# Patient Record
Sex: Male | Born: 1965 | Race: Black or African American | Hispanic: No | Marital: Married | Smoking: Never smoker
Health system: Southern US, Community
[De-identification: ages and names within clinical notes are randomized; demographics above are authoritative.]

---

## 2003-09-06 ENCOUNTER — Emergency Department (HOSPITAL_COMMUNITY): Admission: EM | Admit: 2003-09-06 | Discharge: 2003-09-07 | Payer: Self-pay | Admitting: Emergency Medicine

## 2013-03-11 ENCOUNTER — Other Ambulatory Visit: Payer: Self-pay | Admitting: Family Medicine

## 2013-03-11 ENCOUNTER — Ambulatory Visit
Admission: RE | Admit: 2013-03-11 | Discharge: 2013-03-11 | Disposition: A | Discharge status: Home / Self Care | Payer: BC Managed Care – PPO | Source: Ambulatory Visit | Attending: Family Medicine | Admitting: Family Medicine

## 2013-03-11 DIAGNOSIS — M25512 Pain in left shoulder: Principal | ICD-10-CM

## 2013-03-11 DIAGNOSIS — M25511 Pain in right shoulder: Secondary | ICD-10-CM

## 2013-03-11 DIAGNOSIS — M542 Cervicalgia: Secondary | ICD-10-CM

## 2013-06-10 ENCOUNTER — Other Ambulatory Visit: Payer: Self-pay | Admitting: Family Medicine

## 2013-06-10 DIAGNOSIS — M542 Cervicalgia: Secondary | ICD-10-CM

## 2013-06-16 ENCOUNTER — Other Ambulatory Visit: Payer: BC Managed Care – PPO

## 2013-06-22 ENCOUNTER — Ambulatory Visit
Admission: RE | Admit: 2013-06-22 | Discharge: 2013-06-22 | Disposition: A | Discharge status: Home / Self Care | Payer: BC Managed Care – PPO | Source: Ambulatory Visit | Attending: Family Medicine | Admitting: Family Medicine

## 2013-06-22 DIAGNOSIS — M542 Cervicalgia: Secondary | ICD-10-CM

## 2013-09-28 ENCOUNTER — Other Ambulatory Visit: Payer: Self-pay | Admitting: Family Medicine

## 2013-09-28 DIAGNOSIS — D353 Benign neoplasm of craniopharyngeal duct: Principal | ICD-10-CM

## 2013-09-28 DIAGNOSIS — D352 Benign neoplasm of pituitary gland: Secondary | ICD-10-CM

## 2013-10-06 ENCOUNTER — Other Ambulatory Visit: Payer: BC Managed Care – PPO

## 2013-10-16 ENCOUNTER — Ambulatory Visit
Admission: RE | Admit: 2013-10-16 | Discharge: 2013-10-16 | Disposition: A | Discharge status: Home / Self Care | Payer: BC Managed Care – PPO | Source: Ambulatory Visit | Attending: Family Medicine | Admitting: Family Medicine

## 2013-10-16 DIAGNOSIS — D352 Benign neoplasm of pituitary gland: Secondary | ICD-10-CM

## 2013-10-16 DIAGNOSIS — D353 Benign neoplasm of craniopharyngeal duct: Principal | ICD-10-CM

## 2013-10-16 MED ORDER — GADOBENATE DIMEGLUMINE 529 MG/ML IV SOLN
9.0000 mL | Freq: Once | INTRAVENOUS | Status: AC | PRN
  Administered 2013-10-16: 9 mL via INTRAVENOUS

## 2019-08-02 ENCOUNTER — Other Ambulatory Visit: Payer: Self-pay

## 2019-08-02 ENCOUNTER — Emergency Department (HOSPITAL_COMMUNITY): Payer: No Typology Code available for payment source

## 2019-08-02 ENCOUNTER — Emergency Department (HOSPITAL_COMMUNITY)
Admission: EM | Admit: 2019-08-02 | Discharge: 2019-08-03 | Disposition: A | Discharge status: Home / Self Care | Payer: No Typology Code available for payment source | Attending: Emergency Medicine | Admitting: Emergency Medicine

## 2019-08-02 ENCOUNTER — Encounter (HOSPITAL_COMMUNITY): Payer: Self-pay | Admitting: Emergency Medicine

## 2019-08-02 DIAGNOSIS — Y92410 Unspecified street and highway as the place of occurrence of the external cause: Secondary | ICD-10-CM | POA: Diagnosis not present

## 2019-08-02 DIAGNOSIS — Y998 Other external cause status: Secondary | ICD-10-CM | POA: Diagnosis not present

## 2019-08-02 DIAGNOSIS — S93401A Sprain of unspecified ligament of right ankle, initial encounter: Secondary | ICD-10-CM | POA: Insufficient documentation

## 2019-08-02 DIAGNOSIS — Y9389 Activity, other specified: Secondary | ICD-10-CM | POA: Diagnosis not present

## 2019-08-02 DIAGNOSIS — S301XXA Contusion of abdominal wall, initial encounter: Secondary | ICD-10-CM | POA: Diagnosis not present

## 2019-08-02 DIAGNOSIS — S161XXA Strain of muscle, fascia and tendon at neck level, initial encounter: Secondary | ICD-10-CM | POA: Insufficient documentation

## 2019-08-02 DIAGNOSIS — Z79899 Other long term (current) drug therapy: Secondary | ICD-10-CM | POA: Diagnosis not present

## 2019-08-02 DIAGNOSIS — M25512 Pain in left shoulder: Secondary | ICD-10-CM | POA: Insufficient documentation

## 2019-08-02 DIAGNOSIS — S3991XA Unspecified injury of abdomen, initial encounter: Secondary | ICD-10-CM | POA: Diagnosis present

## 2019-08-02 LAB — COMPREHENSIVE METABOLIC PANEL
ALT: 43 U/L (ref 0–44)
AST: 28 U/L (ref 15–41)
Albumin: 4.1 g/dL (ref 3.5–5.0)
Alkaline Phosphatase: 62 U/L (ref 38–126)
Anion gap: 8 (ref 5–15)
BUN: 10 mg/dL (ref 6–20)
CO2: 28 mmol/L (ref 22–32)
Calcium: 9.4 mg/dL (ref 8.9–10.3)
Chloride: 103 mmol/L (ref 98–111)
Creatinine, Ser: 1.15 mg/dL (ref 0.61–1.24)
GFR calc Af Amer: >60 mL/min (ref >60)
GFR calc non Af Amer: >60 mL/min (ref >60)
Glucose, Bld: 104 mg/dL — ABNORMAL HIGH (ref 70–99)
Potassium: 3.6 mmol/L (ref 3.5–5.1)
Sodium: 139 mmol/L (ref 135–145)
Total Bilirubin: 1 mg/dL (ref 0.3–1.2)
Total Protein: 7 g/dL (ref 6.5–8.1)

## 2019-08-02 LAB — CBC WITH DIFFERENTIAL/PLATELET
Abs Immature Granulocytes: 0.01 10*3/uL (ref 0.00–0.07)
Basophils Absolute: 0 10*3/uL (ref 0.0–0.1)
Basophils Relative: 1 %
Eosinophils Absolute: 0.7 10*3/uL — ABNORMAL HIGH (ref 0.0–0.5)
Eosinophils Relative: 12 %
HCT: 44.1 % (ref 39.0–52.0)
Hemoglobin: 14.7 g/dL (ref 13.0–17.0)
Immature Granulocytes: 0 %
Lymphocytes Relative: 31 %
Lymphs Abs: 1.7 10*3/uL (ref 0.7–4.0)
MCH: 28.2 pg (ref 26.0–34.0)
MCHC: 33.3 g/dL (ref 30.0–36.0)
MCV: 84.5 fL (ref 80.0–100.0)
Monocytes Absolute: 0.6 10*3/uL (ref 0.1–1.0)
Monocytes Relative: 11 %
Neutro Abs: 2.4 10*3/uL (ref 1.7–7.7)
Neutrophils Relative %: 45 %
Platelets: 319 10*3/uL (ref 150–400)
RBC: 5.22 MIL/uL (ref 4.22–5.81)
RDW: 13.3 % (ref 11.5–15.5)
WBC: 5.4 10*3/uL (ref 4.0–10.5)
nRBC: 0 % (ref 0.0–0.2)

## 2019-08-02 MED ORDER — ACETAMINOPHEN 500 MG PO TABS
1000.0000 mg | ORAL_TABLET | Freq: Once | ORAL | Status: AC
  Administered 2019-08-02: 1000 mg via ORAL
  Filled 2019-08-02: qty 2

## 2019-08-02 MED ORDER — METHOCARBAMOL 500 MG PO TABS: 500.0000 mg | ORAL_TABLET | Freq: Two times a day (BID) | ORAL | 0 refills | Status: AC

## 2019-08-02 MED ORDER — METHOCARBAMOL 500 MG PO TABS
750.0000 mg | ORAL_TABLET | Freq: Once | ORAL | Status: AC
  Administered 2019-08-02: 750 mg via ORAL
  Filled 2019-08-02: qty 2

## 2019-08-02 MED ORDER — IOHEXOL 300 MG/ML  SOLN
100.0000 mL | Freq: Once | INTRAMUSCULAR | Status: AC | PRN
  Administered 2019-08-03: 100 mL via INTRAVENOUS

## 2019-08-02 MED ORDER — METHOCARBAMOL 500 MG PO TABS: 500.0000 mg | ORAL_TABLET | Freq: Two times a day (BID) | ORAL | 0 refills | Status: DC
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
## 2019-08-02 NOTE — Discharge Instructions (Addendum)
Your CT imaging and x-rays were reassuring.  There is no fractures or evidence of intra-abdominal bleeding.  Please drink plenty of water, rest, take Tylenol ibuprofen as recommended below and you may use Robaxin for breakthrough pain.  Please use Tylenol or ibuprofen for pain.  You may use 600 mg ibuprofen every 6 hours or 1000 mg of Tylenol every 6 hours.  You may choose to alternate between the 2.  This would be most effective.  Not to exceed 4 g of Tylenol within 24 hours.  Not to exceed 3200 mg ibuprofen 24 hours.  Please return to emergency department for any new or concerning symptoms-your work-up today was reassuring.
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
## 2019-08-03 NOTE — ED Notes (Signed)
Verbalized understanding of DC instructions, Rx, follow up care. Ambulatory 
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
## 2019-11-29 ENCOUNTER — Emergency Department (HOSPITAL_COMMUNITY)
Admission: EM | Admit: 2019-11-29 | Discharge: 2019-11-29 | Disposition: A | Discharge status: Home / Self Care | Payer: No Typology Code available for payment source | Attending: Emergency Medicine | Admitting: Emergency Medicine

## 2019-11-29 ENCOUNTER — Encounter (HOSPITAL_COMMUNITY): Payer: Self-pay | Admitting: Emergency Medicine

## 2019-11-29 ENCOUNTER — Other Ambulatory Visit: Payer: Self-pay

## 2019-11-29 DIAGNOSIS — U071 COVID-19: Secondary | ICD-10-CM | POA: Diagnosis not present

## 2019-11-29 DIAGNOSIS — R059 Cough, unspecified: Secondary | ICD-10-CM | POA: Diagnosis present

## 2019-11-29 LAB — RESPIRATORY PANEL BY RT PCR (FLU A&B, COVID)
Influenza A by PCR: NEGATIVE
Influenza B by PCR: NEGATIVE
SARS Coronavirus 2 by RT PCR: POSITIVE — AB

## 2019-11-29 LAB — CBG MONITORING, ED: Glucose-Capillary: 155 mg/dL — ABNORMAL HIGH (ref 70–99)

## 2019-11-29 NOTE — ED Notes (Signed)
Discharge instructions reviewed with patient. Patient verbalized understanding, no further questions for ED staff at this time.

## 2019-11-29 NOTE — ED Triage Notes (Signed)
Patient reports loss of taste and smell for several days , denies fever or chills , respirations unlabored, unvaccinated against Covid 19.

## 2019-11-29 NOTE — ED Provider Notes (Signed)
[facility] Provider Note   CSN: [id number] Arrival date & time: 11/29/19  0103     History Chief Complaint  Patient presents with  . Loss of Taste and Smell    [facility] is a 54 y.o. male.  Patient presents to the emergency department with a chief complaint of loss of taste and smell.  He also reports having had some diarrhea and slight cough.  He states that his symptoms started on 11/3 (11 days ago).  He denies any fevers or chills.  Denies any known Covid exposures.  He is unvaccinated against Covid.  He has been taking NyQuil with good relief.  He denies any other associated symptoms.  He is concerned about the persistent loss of taste and smell.  The history is provided by the patient. No language interpreter was used.       History reviewed. No pertinent past medical history.  There are no problems to display for this patient.   History reviewed. No pertinent surgical history.     No family history on file.  Social History   Tobacco Use  . Smoking status: Never Smoker  . Smokeless tobacco: Never Used  Substance Use Topics  . Alcohol use: Not Currently  . Drug use: Not Currently    Home Medications Prior to Admission medications   Medication Sig Start Date End Date Taking? Authorizing Provider  acetaminophen (TYLENOL) 500 MG tablet Take 500 mg by mouth every 6 (six) hours as needed for mild pain.    [provider]  amLODipine (NORVASC) 10 MG tablet Take 10 mg by mouth daily.    [provider]  cholecalciferol (VITAMIN D3) 25 MCG (1000 UNIT) tablet Take 1,000 Units by mouth daily.    [provider]  cyclobenzaprine (FLEXERIL) 10 MG tablet Take 10 mg by mouth at bedtime.    [provider]  ibuprofen (ADVIL) 200 MG tablet Take 200 mg by mouth every 6 (six) hours as needed for moderate pain.    [provider]  meloxicam (MOBIC) 15 MG tablet Take 15 mg by mouth daily.     [provider]  methocarbamol (ROBAXIN) 500 MG tablet Take 1 tablet (500 mg total) by mouth 2 (two) times daily. 08/02/19   Tedd Sias, PA    Allergies    Patient has no known allergies.  Review of Systems   Review of Systems  All other systems reviewed and are negative.   Physical Exam Updated Vital Signs BP 132/85   Pulse 77   Temp 97.8 F (36.6 C) (Oral)   Resp 10   Ht 5\' 11"  (1.803 m)   Wt 102 kg   SpO2 95%   BMI 31.36 kg/m   Physical Exam Vitals and nursing note reviewed.  Constitutional:      Appearance: He is well-developed.  HENT:     Head: Normocephalic and atraumatic.  Eyes:     Conjunctiva/sclera: Conjunctivae normal.  Cardiovascular:     Rate and Rhythm: Normal rate and regular rhythm.     Heart sounds: No murmur heard.   Pulmonary:     Effort: Pulmonary effort is normal. No respiratory distress.     Breath sounds: Normal breath sounds.  Abdominal:     Palpations: Abdomen is soft.     Tenderness: There is no abdominal tenderness.  Musculoskeletal:     Cervical back: Neck supple.  Skin:    General: Skin is warm and dry.  Neurological:  Mental Status: He is alert.     ED Results / Procedures / Treatments   Labs (all labs ordered are listed, but only abnormal results are displayed) Labs Reviewed  RESPIRATORY PANEL BY RT PCR (FLU A&B, COVID) - Abnormal; Notable for the following components:      Result Value   SARS Coronavirus 2 by RT PCR POSITIVE (*)    All other components within normal limits  CBG MONITORING, ED - Abnormal; Notable for the following components:   Glucose-Capillary 155 (*)    All other components within normal limits    EKG EKG Interpretation  Date/Time:  Sunday November 29 2019 01:39:07 EST Ventricular Rate:  87 PR Interval:    QRS Duration: 106 QT Interval:  395 QTC Calculation: 476 R Axis:   47 Text Interpretation: Sinus rhythm Confirmed by Randal Buba, April ([id number]) on 11/29/2019 1:59:13  AM   Radiology No results found.  Procedures Procedures (including critical care time)  Medications Ordered in ED Medications - No data to display  ED Course  I have reviewed the triage vital signs and the nursing notes.  Pertinent labs & imaging results that were available during my care of the patient were reviewed by me and considered in my medical decision making (see chart for details).    MDM Rules/Calculators/A&P                           [facility] was evaluated in Emergency Department on 11/29/2019 for the symptoms described in the history of present illness. He was evaluated in the context of the global COVID-19 pandemic, which necessitated consideration that the patient might be at risk for infection with the SARS-CoV-2 virus that causes COVID-19. Institutional protocols and algorithms that pertain to the evaluation of patients at risk for COVID-19 are in a state of rapid change based on information released by regulatory bodies including the CDC and federal and state organizations. These policies and algorithms were followed during the patient's care in the ED. Patients symptoms are consistent with COVID. Discussed that antibiotics are not indicated for viral infections. Pt will be discharged with symptomatic treatment.  Verbalizes understanding and is agreeable with plan. Pt is hemodynamically stable & in NAD prior to dc.   Final Clinical Impression(s) / ED Diagnoses Final diagnoses:  [id number]    Rx / DC Orders ED Discharge Orders    None       Montine Circle, PA-C 11/29/19 0600    Palumbo, April, MD 11/29/19 [phone]

## 2019-11-30 ENCOUNTER — Telehealth: Payer: Self-pay | Admitting: Nurse Practitioner

## 2019-11-30 NOTE — Telephone Encounter (Signed)
Called to Discuss with patient about Covid symptoms and the use of the monoclonal antibody infusion for those with mild to moderate Covid symptoms and at a high risk of hospitalization.     Pt appears to qualify for this infusion due to co-morbid conditions and/or a member of an at-risk group in accordance with the FDA Emergency Use Authorization. (BMI >25, hypertension).    Patient reports symptoms are resolving. He is able to smell things today and aches have resolved. Declines infusion. States he is planning to get vaccinated as soon as he can expressing wishes to have trust the medical professions prior to this.   Alda Lea, NP [facility]  [phone]

## 2020-06-23 ENCOUNTER — Other Ambulatory Visit: Payer: Self-pay

## 2020-06-23 ENCOUNTER — Ambulatory Visit (HOSPITAL_COMMUNITY)
Admission: EM | Admit: 2020-06-23 | Discharge: 2020-06-23 | Disposition: A | Discharge status: Home / Self Care | Payer: No Typology Code available for payment source | Attending: Family Medicine | Admitting: Family Medicine

## 2020-06-23 ENCOUNTER — Encounter (HOSPITAL_COMMUNITY): Payer: Self-pay

## 2020-06-23 DIAGNOSIS — S61210A Laceration without foreign body of right index finger without damage to nail, initial encounter: Secondary | ICD-10-CM | POA: Diagnosis not present

## 2020-06-23 DIAGNOSIS — Z23 Encounter for immunization: Secondary | ICD-10-CM

## 2020-06-23 MED ORDER — TETANUS-DIPHTH-ACELL PERTUSSIS 5-2.5-18.5 LF-MCG/0.5 IM SUSY
PREFILLED_SYRINGE | INTRAMUSCULAR | Status: AC
  Filled 2020-06-23: qty 0.5

## 2020-06-23 MED ORDER — LIDOCAINE HCL 2 % IJ SOLN
INTRAMUSCULAR | Status: AC
  Filled 2020-06-23: qty 20

## 2020-06-23 MED ORDER — TETANUS-DIPHTH-ACELL PERTUSSIS 5-2.5-18.5 LF-MCG/0.5 IM SUSY
0.5000 mL | PREFILLED_SYRINGE | Freq: Once | INTRAMUSCULAR | Status: AC
  Administered 2020-06-23: 15:00:00 0.5 mL via INTRAMUSCULAR

## 2020-06-23 NOTE — ED Provider Notes (Signed)
  [facility]   [id number] 06/23/20 Arrival Time: 1207  ASSESSMENT & PLAN:  1. Laceration of right index finger without foreign body without damage to nail, initial encounter     Meds ordered this encounter  Medications   Tdap (BOOSTRIX) injection 0.5 mL   Procedure: Verbal consent obtained. Patient provided with risks and alternatives to the procedure. Wound copiously irrigated with NS then cleansed with betadine. Digital block with 1% plain lidocaine. Wound carefully explored. No foreign body, tendon injury, or nonviable tissue were noted. Using sterile technique, 3 interrupted 4-0 Prolene sutures were placed to reapproximate the wound. Procedure tolerated well. No complications. Minimal bleeding. Advised to look for and return for any signs of infection such as redness, swelling, discharge, or worsening pain. Return for suture removal in 7-10 days.    Discharge Instructions       Meds ordered this encounter  Medications   Tdap (BOOSTRIX) injection 0.5 mL        See AVS for wound care instruction.  Reviewed expectations re: course of current medical issues. Questions answered. Outlined signs and symptoms indicating need for more acute intervention. Patient verbalized understanding. After Visit Summary given.   SUBJECTIVE:  [facility] is a 55 y.o. male who presents with a laceration of RIGHT distal 2nd finger; cut on metal; washed; moderate bleeding. No extremity sensation changes or weakness.   Td UTD: No.  OBJECTIVE:  Vitals:   06/23/20 1343  BP: (!) 166/98  Pulse: 97  Resp: 19  Temp: 98.6 F (37 C)  TempSrc: Oral  SpO2: 100%     General appearance: alert; no distress Skin: curved laceration of distal RIGHT 2nd fingertip; size: approx 1 cm; clean wound edges, no foreign bodies; without active bleeding; no nail involvement; normal cap refill and distal sensation Psychological: alert and cooperative; normal mood and affect   No Known  Allergies  History reviewed. No pertinent past medical history. Social History   Socioeconomic History   Marital status: Married    Spouse name: Not on file   Number of children: Not on file   Years of education: Not on file   Highest education level: Not on file  Occupational History   Not on file  Tobacco Use   Smoking status: Never   Smokeless tobacco: Never  Substance and Sexual Activity   Alcohol use: Not Currently   Drug use: Not Currently   Sexual activity: Not on file  Other Topics Concern   Not on file  Social History Narrative   Not on file   Social Determinants of Health   Financial Resource Strain: Not on file  Food Insecurity: Not on file  Transportation Needs: Not on file  Physical Activity: Not on file  Stress: Not on file  Social Connections: Not on file          Vanessa Kick, MD 06/23/20 1750

## 2020-06-23 NOTE — ED Triage Notes (Signed)
Pt in with c/o right index finger laceration that occurred today while he was cutting insulation  Pt denies numbness or tingling in hand

## 2020-06-23 NOTE — Discharge Instructions (Addendum)
Meds ordered this encounter  Medications   Tdap (BOOSTRIX) injection 0.5 mL    
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
## 2021-07-09 ENCOUNTER — Inpatient Hospital Stay (HOSPITAL_COMMUNITY)
Admission: EM | Admit: 2021-07-09 | Discharge: 2021-07-14 | DRG: 917 | Disposition: A | Discharge status: Home / Self Care | Payer: No Typology Code available for payment source | Attending: Internal Medicine | Admitting: Internal Medicine

## 2021-07-09 ENCOUNTER — Emergency Department (HOSPITAL_COMMUNITY): Payer: No Typology Code available for payment source

## 2021-07-09 DIAGNOSIS — T50901A Poisoning by unspecified drugs, medicaments and biological substances, accidental (unintentional), initial encounter: Secondary | ICD-10-CM | POA: Diagnosis present

## 2021-07-09 DIAGNOSIS — J9601 Acute respiratory failure with hypoxia: Secondary | ICD-10-CM

## 2021-07-09 DIAGNOSIS — T50904A Poisoning by unspecified drugs, medicaments and biological substances, undetermined, initial encounter: Principal | ICD-10-CM

## 2021-07-09 DIAGNOSIS — J690 Pneumonitis due to inhalation of food and vomit: Secondary | ICD-10-CM

## 2021-07-09 DIAGNOSIS — R748 Abnormal levels of other serum enzymes: Secondary | ICD-10-CM | POA: Diagnosis present

## 2021-07-09 DIAGNOSIS — E876 Hypokalemia: Secondary | ICD-10-CM | POA: Diagnosis present

## 2021-07-09 DIAGNOSIS — I959 Hypotension, unspecified: Secondary | ICD-10-CM | POA: Diagnosis present

## 2021-07-09 DIAGNOSIS — I10 Essential (primary) hypertension: Secondary | ICD-10-CM | POA: Diagnosis present

## 2021-07-09 DIAGNOSIS — N179 Acute kidney failure, unspecified: Secondary | ICD-10-CM | POA: Diagnosis present

## 2021-07-09 DIAGNOSIS — F1910 Other psychoactive substance abuse, uncomplicated: Secondary | ICD-10-CM | POA: Diagnosis present

## 2021-07-09 DIAGNOSIS — K5900 Constipation, unspecified: Secondary | ICD-10-CM | POA: Diagnosis not present

## 2021-07-09 DIAGNOSIS — T40601A Poisoning by unspecified narcotics, accidental (unintentional), initial encounter: Secondary | ICD-10-CM | POA: Diagnosis present

## 2021-07-09 DIAGNOSIS — K219 Gastro-esophageal reflux disease without esophagitis: Secondary | ICD-10-CM | POA: Diagnosis present

## 2021-07-09 DIAGNOSIS — E872 Acidosis, unspecified: Secondary | ICD-10-CM | POA: Diagnosis present

## 2021-07-09 DIAGNOSIS — S36119A Unspecified injury of liver, initial encounter: Secondary | ICD-10-CM | POA: Diagnosis present

## 2021-07-09 DIAGNOSIS — G928 Other toxic encephalopathy: Secondary | ICD-10-CM | POA: Diagnosis present

## 2021-07-09 DIAGNOSIS — J9612 Chronic respiratory failure with hypercapnia: Secondary | ICD-10-CM | POA: Diagnosis present

## 2021-07-09 DIAGNOSIS — R739 Hyperglycemia, unspecified: Secondary | ICD-10-CM | POA: Diagnosis present

## 2021-07-09 DIAGNOSIS — X58XXXA Exposure to other specified factors, initial encounter: Secondary | ICD-10-CM | POA: Diagnosis present

## 2021-07-09 DIAGNOSIS — R7401 Elevation of levels of liver transaminase levels: Secondary | ICD-10-CM | POA: Diagnosis present

## 2021-07-09 DIAGNOSIS — M1990 Unspecified osteoarthritis, unspecified site: Secondary | ICD-10-CM | POA: Diagnosis present

## 2021-07-09 DIAGNOSIS — S00511A Abrasion of lip, initial encounter: Secondary | ICD-10-CM | POA: Diagnosis present

## 2021-07-09 DIAGNOSIS — R4182 Altered mental status, unspecified: Secondary | ICD-10-CM | POA: Diagnosis present

## 2021-07-09 LAB — URINALYSIS, ROUTINE W REFLEX MICROSCOPIC
Bilirubin Urine: NEGATIVE
Glucose, UA: >=500 mg/dL — AB
Ketones, ur: NEGATIVE mg/dL
Leukocytes,Ua: NEGATIVE
Nitrite: NEGATIVE
Protein, ur: 100 mg/dL — AB
Specific Gravity, Urine: 1.017 (ref 1.005–1.030)
pH: 6 (ref 5.0–8.0)

## 2021-07-09 LAB — COMPREHENSIVE METABOLIC PANEL
ALT: 77 U/L — ABNORMAL HIGH (ref 0–44)
AST: 86 U/L — ABNORMAL HIGH (ref 15–41)
Albumin: 3.7 g/dL (ref 3.5–5.0)
Alkaline Phosphatase: 67 U/L (ref 38–126)
Anion gap: 14 (ref 5–15)
BUN: 10 mg/dL (ref 6–20)
CO2: 27 mmol/L (ref 22–32)
Calcium: 8.6 mg/dL — ABNORMAL LOW (ref 8.9–10.3)
Chloride: 96 mmol/L — ABNORMAL LOW (ref 98–111)
Creatinine, Ser: 1.83 mg/dL — ABNORMAL HIGH (ref 0.61–1.24)
GFR, Estimated: 24 mL/min — ABNORMAL LOW (ref >60)
Glucose, Bld: 324 mg/dL — ABNORMAL HIGH (ref 70–99)
Potassium: 3.4 mmol/L — ABNORMAL LOW (ref 3.5–5.1)
Sodium: 137 mmol/L (ref 135–145)
Total Bilirubin: 1.2 mg/dL (ref 0.3–1.2)
Total Protein: 6.7 g/dL (ref 6.5–8.1)

## 2021-07-09 LAB — I-STAT ARTERIAL BLOOD GAS, ED
Acid-Base Excess: 3 mmol/L — ABNORMAL HIGH (ref 0.0–2.0)
Bicarbonate: 29.9 mmol/L — ABNORMAL HIGH (ref 20.0–28.0)
Calcium, Ion: 1.15 mmol/L (ref 1.15–1.40)
HCT: 42 % (ref 39.0–52.0)
Hemoglobin: 14.3 g/dL (ref 13.0–17.0)
O2 Saturation: 99 %
Patient temperature: 97.6
Potassium: 3.1 mmol/L — ABNORMAL LOW (ref 3.5–5.1)
Sodium: 138 mmol/L (ref 135–145)
TCO2: 32 mmol/L (ref 22–32)
pCO2 arterial: 53.8 mmHg — ABNORMAL HIGH (ref 32–48)
pH, Arterial: 7.351 (ref 7.35–7.45)
pO2, Arterial: 167 mmHg — ABNORMAL HIGH (ref 83–108)

## 2021-07-09 LAB — I-STAT CHEM 8, ED
BUN: 11 mg/dL (ref 6–20)
Calcium, Ion: 1.05 mmol/L — ABNORMAL LOW (ref 1.15–1.40)
Chloride: 97 mmol/L — ABNORMAL LOW (ref 98–111)
Creatinine, Ser: 1.7 mg/dL — ABNORMAL HIGH (ref 0.61–1.24)
Glucose, Bld: 325 mg/dL — ABNORMAL HIGH (ref 70–99)
HCT: 49 % (ref 39.0–52.0)
Hemoglobin: 16.7 g/dL (ref 13.0–17.0)
Potassium: 3.3 mmol/L — ABNORMAL LOW (ref 3.5–5.1)
Sodium: 138 mmol/L (ref 135–145)
TCO2: 29 mmol/L (ref 22–32)

## 2021-07-09 LAB — CBC WITH DIFFERENTIAL/PLATELET
Abs Immature Granulocytes: 0.14 10*3/uL — ABNORMAL HIGH (ref 0.00–0.07)
Basophils Absolute: 0.1 10*3/uL (ref 0.0–0.1)
Basophils Relative: 0 %
Eosinophils Absolute: 0.8 10*3/uL — ABNORMAL HIGH (ref 0.0–0.5)
Eosinophils Relative: 6 %
HCT: 48.5 % (ref 39.0–52.0)
Hemoglobin: 16 g/dL (ref 13.0–17.0)
Immature Granulocytes: 1 %
Lymphocytes Relative: 25 %
Lymphs Abs: 3.1 10*3/uL (ref 0.7–4.0)
MCH: 29 pg (ref 26.0–34.0)
MCHC: 33 g/dL (ref 30.0–36.0)
MCV: 88 fL (ref 80.0–100.0)
Monocytes Absolute: 0.7 10*3/uL (ref 0.1–1.0)
Monocytes Relative: 6 %
Neutro Abs: 7.4 10*3/uL (ref 1.7–7.7)
Neutrophils Relative %: 62 %
Platelets: 396 10*3/uL (ref 150–400)
RBC: 5.51 MIL/uL (ref 4.22–5.81)
RDW: 13.3 % (ref 11.5–15.5)
WBC: 12.1 10*3/uL — ABNORMAL HIGH (ref 4.0–10.5)
nRBC: 0 % (ref 0.0–0.2)

## 2021-07-09 LAB — RAPID URINE DRUG SCREEN, HOSP PERFORMED
Amphetamines: NOT DETECTED
Barbiturates: NOT DETECTED
Benzodiazepines: POSITIVE — AB
Cocaine: POSITIVE — AB
Opiates: NOT DETECTED
Tetrahydrocannabinol: NOT DETECTED

## 2021-07-09 LAB — HEPATITIS PANEL, ACUTE
HCV Ab: NONREACTIVE
Hep A IgM: NONREACTIVE
Hep B C IgM: NONREACTIVE
Hepatitis B Surface Ag: NONREACTIVE

## 2021-07-09 LAB — GLUCOSE, CAPILLARY
Glucose-Capillary: 135 mg/dL — ABNORMAL HIGH (ref 70–99)
Glucose-Capillary: 58 mg/dL — ABNORMAL LOW (ref 70–99)
Glucose-Capillary: 93 mg/dL (ref 70–99)
Glucose-Capillary: 98 mg/dL (ref 70–99)

## 2021-07-09 LAB — CK: Total CK: 182 U/L (ref 49–397)

## 2021-07-09 LAB — ETHANOL: Alcohol, Ethyl (B): <10 mg/dL (ref <10)

## 2021-07-09 LAB — LACTIC ACID, PLASMA
Lactic Acid, Venous: 2.7 mmol/L (ref 0.5–1.9)
Lactic Acid, Venous: 4.5 mmol/L (ref 0.5–1.9)

## 2021-07-09 LAB — SALICYLATE LEVEL: Salicylate Lvl: <7 mg/dL — ABNORMAL LOW (ref 7.0–30.0)

## 2021-07-09 LAB — HEMOGLOBIN A1C
Hgb A1c MFr Bld: 5.5 % (ref 4.8–5.6)
Mean Plasma Glucose: 111.15 mg/dL

## 2021-07-09 LAB — ACETAMINOPHEN LEVEL: Acetaminophen (Tylenol), Serum: <10 ug/mL — ABNORMAL LOW (ref 10–30)

## 2021-07-09 MED ORDER — POLYETHYLENE GLYCOL 3350 17 G PO PACK: 17.0000 g | PACK | Freq: Every day | ORAL | Status: DC | PRN

## 2021-07-09 MED ORDER — PANTOPRAZOLE 2 MG/ML SUSPENSION
40.0000 mg | Freq: Every day | ORAL | Status: DC
  Administered 2021-07-09 – 2021-07-11 (×3): 40 mg
  Filled 2021-07-09 (×5): qty 20

## 2021-07-09 MED ORDER — ETOMIDATE 2 MG/ML IV SOLN
INTRAVENOUS | Status: AC | PRN
  Administered 2021-07-09: 20 mg via INTRAVENOUS

## 2021-07-09 MED ORDER — POTASSIUM CHLORIDE 20 MEQ PO PACK
40.0000 meq | PACK | Freq: Once | ORAL | Status: AC
  Administered 2021-07-09: 40 meq
  Filled 2021-07-09: qty 2

## 2021-07-09 MED ORDER — FENTANYL CITRATE (PF) 100 MCG/2ML IJ SOLN: 50.0000 ug | INTRAMUSCULAR | Status: DC | PRN

## 2021-07-09 MED ORDER — POLYETHYLENE GLYCOL 3350 17 G PO PACK
17.0000 g | PACK | Freq: Every day | ORAL | Status: DC
  Administered 2021-07-09 – 2021-07-10 (×2): 17 g
  Filled 2021-07-09 (×2): qty 1

## 2021-07-09 MED ORDER — ORAL CARE MOUTH RINSE
15.0000 mL | OROMUCOSAL | Status: DC
  Administered 2021-07-09 – 2021-07-11 (×23): 15 mL via OROMUCOSAL

## 2021-07-09 MED ORDER — LACTATED RINGERS IV SOLN
INTRAVENOUS | Status: AC
Start: 2021-07-09 — End: 2021-07-10

## 2021-07-09 MED ORDER — SODIUM CHLORIDE 0.9 % IV SOLN
3.0000 g | Freq: Four times a day (QID) | INTRAVENOUS | Status: DC
  Administered 2021-07-09 – 2021-07-12 (×11): 3 g via INTRAVENOUS
  Filled 2021-07-09 (×11): qty 8

## 2021-07-09 MED ORDER — INSULIN ASPART 100 UNIT/ML IJ SOLN: 0.0000 [IU] | INTRAMUSCULAR | Status: DC

## 2021-07-09 MED ORDER — DEXTROSE 50 % IV SOLN
INTRAVENOUS | Status: AC
  Administered 2021-07-09: 50 mL
  Filled 2021-07-09: qty 50

## 2021-07-09 MED ORDER — ONDANSETRON HCL 4 MG/2ML IJ SOLN
4.0000 mg | Freq: Four times a day (QID) | INTRAMUSCULAR | Status: DC | PRN
  Administered 2021-07-09 – 2021-07-12 (×3): 4 mg via INTRAVENOUS
  Filled 2021-07-09 (×3): qty 2

## 2021-07-09 MED ORDER — PROPOFOL 1000 MG/100ML IV EMUL
INTRAVENOUS | Status: AC | PRN
  Administered 2021-07-09: 20 ug/kg/min via INTRAVENOUS

## 2021-07-09 MED ORDER — PROPOFOL 1000 MG/100ML IV EMUL
5.0000 ug/kg/min | INTRAVENOUS | Status: DC
  Administered 2021-07-09: 40 ug/kg/min via INTRAVENOUS
  Administered 2021-07-09: 20 ug/kg/min via INTRAVENOUS
  Administered 2021-07-10: 30 ug/kg/min via INTRAVENOUS
  Administered 2021-07-10: 40 ug/kg/min via INTRAVENOUS
  Administered 2021-07-10: 50 ug/kg/min via INTRAVENOUS
  Administered 2021-07-10: 40 ug/kg/min via INTRAVENOUS
  Administered 2021-07-10: 35 ug/kg/min via INTRAVENOUS
  Administered 2021-07-11 (×3): 60 ug/kg/min via INTRAVENOUS
  Filled 2021-07-09 (×9): qty 100

## 2021-07-09 MED ORDER — CHLORHEXIDINE GLUCONATE CLOTH 2 % EX PADS
6.0000 | MEDICATED_PAD | Freq: Every day | CUTANEOUS | Status: DC
  Administered 2021-07-10 – 2021-07-13 (×4): 6 via TOPICAL

## 2021-07-09 MED ORDER — DOCUSATE SODIUM 100 MG PO CAPS: 100.0000 mg | ORAL_CAPSULE | Freq: Two times a day (BID) | ORAL | Status: DC | PRN

## 2021-07-09 MED ORDER — MIDAZOLAM HCL 2 MG/2ML IJ SOLN
2.0000 mg | INTRAMUSCULAR | Status: DC | PRN
  Administered 2021-07-10 – 2021-07-11 (×3): 2 mg via INTRAVENOUS
  Filled 2021-07-09 (×2): qty 2

## 2021-07-09 MED ORDER — SODIUM CHLORIDE 0.9 % IV BOLUS
1000.0000 mL | Freq: Once | INTRAVENOUS | Status: AC
  Administered 2021-07-09: 1000 mL via INTRAVENOUS

## 2021-07-09 MED ORDER — FENTANYL CITRATE PF 50 MCG/ML IJ SOSY: 50.0000 ug | PREFILLED_SYRINGE | INTRAMUSCULAR | Status: DC | PRN

## 2021-07-09 MED ORDER — PANTOPRAZOLE SODIUM 40 MG PO TBEC: 40.0000 mg | DELAYED_RELEASE_TABLET | Freq: Every day | ORAL | Status: DC

## 2021-07-09 MED ORDER — CALCIUM GLUCONATE-NACL 1-0.675 GM/50ML-% IV SOLN
1.0000 g | Freq: Once | INTRAVENOUS | Status: AC
  Administered 2021-07-09: 1000 mg via INTRAVENOUS
  Filled 2021-07-09: qty 50

## 2021-07-09 MED ORDER — NALOXONE HCL 4 MG/10ML IJ SOLN
0.0000 mg/h | INTRAVENOUS | Status: DC
  Administered 2021-07-09: 1 mg/h via INTRAVENOUS
  Filled 2021-07-09: qty 10

## 2021-07-09 MED ORDER — DOCUSATE SODIUM 50 MG/5ML PO LIQD
100.0000 mg | Freq: Two times a day (BID) | ORAL | Status: DC
  Administered 2021-07-10 – 2021-07-11 (×3): 100 mg
  Filled 2021-07-09 (×3): qty 10

## 2021-07-09 MED ORDER — ROCURONIUM BROMIDE 50 MG/5ML IV SOLN
INTRAVENOUS | Status: AC | PRN
  Administered 2021-07-09: 100 mg via INTRAVENOUS

## 2021-07-09 MED ORDER — ORAL CARE MOUTH RINSE: 15.0000 mL | OROMUCOSAL | Status: DC | PRN

## 2021-07-09 MED ORDER — HEPARIN SODIUM (PORCINE) 5000 UNIT/ML IJ SOLN
5000.0000 [IU] | Freq: Three times a day (TID) | INTRAMUSCULAR | Status: DC
  Administered 2021-07-09 – 2021-07-14 (×14): 5000 [IU] via SUBCUTANEOUS
  Filled 2021-07-09 (×14): qty 1

## 2021-07-09 MED ORDER — DOCUSATE SODIUM 50 MG/5ML PO LIQD: 100.0000 mg | Freq: Two times a day (BID) | ORAL | Status: DC | PRN

## 2021-07-09 MED ORDER — MIDAZOLAM HCL 2 MG/2ML IJ SOLN
2.0000 mg | INTRAMUSCULAR | Status: DC | PRN
  Administered 2021-07-10: 2 mg via INTRAVENOUS
  Filled 2021-07-09 (×2): qty 2

## 2021-07-09 NOTE — ED Triage Notes (Signed)
Patient arrived by [facility] from [facility] after being found apneic and unresponsive. Sats found to be in 60s with RR of 4.  Patient was given '4mg'$  of narcan by EMS and EMS reports they then tried to intubate. Patient arrived with emesis covering face and in nose. Patient altered and moving minimal air. MD at bedside along with pharmacy and RT.  Nasal trumpet in right nare on arrival

## 2021-07-09 NOTE — Progress Notes (Signed)
[place] Progress Note Patient Name: [facility] DOB: 08-May-1965 MRN: [id number]   Date of Service  07/09/2021  HPI/Events of Note  Pt intubated, sedated, with vomiting episodes.   eICU Interventions  - Airway currently protected. - Pt already on empiric antibiotics for aspiration.  - Given due zofran.  - Will keep intubated overnight - Continue propofol infusion, d/c narcan.  - Will plan to given PRN fentanyl/midazolam for episodes of pain/agitation.  - Plan for daily sedation vacation, SBT.         [name] 07/09/2021, 9:18 PM

## 2021-07-09 NOTE — H&P (Signed)
NAME:  [facility], MRN:  [id number], DOB:  01/15/1873, LOS: 0 ADMISSION DATE:  07/09/2021, CONSULTATION DATE:  07/09/21 REFERRING MD:  Darl Householder - EM, CHIEF COMPLAINT:  AMS   History of Present Illness:  This is a [facility] adult approx middle aged who was BIB EMS after being found unresponsive and essentially apneic, and hypoxic. He received '4mg'$  narcan with EMS and began vomiting and remained minimally responsive. EMS attempted to place a [facility] for airway protection but was not successful. He arrived to ED on NRB with emesis in oropharynx and nasopharynx. He was intubated by ED    PCCM is consulted for admission in this setting   Pertinent  Medical History  Suspected drug use   Significant Hospital Events: Including procedures, antibiotic start and stop dates in addition to other pertinent events   6/25 [facility] suspected OD. Intubated   Interim History / Subjective:  Intubated in ED   Objective   Blood pressure 91/63, pulse 86, temperature 97.8 F (36.6 C), resp. rate (!) 22, height '6\' 1"'$  (1.854 m), weight 95.3 kg, SpO2 100 %.    Vent Mode: PRVC FiO2 (%):  [100 %] 100 % Set Rate:  [18 bmp-22 bmp] 22 bmp Vt Set:  [570 mL] 570 mL PEEP:  [5 cmH20] 5 cmH20 Plateau Pressure:  [17 cmH20] 17 cmH20  No intake or output data in the 24 hours ending 07/09/21 1726 Filed Weights   07/09/21 1700  Weight: 95.3 kg    Examination: General: WDWN middle aged M intubated NAD HENT: NCAT. Pink mm ETT secure. Lip abrasion and slightly edematous upper lip Lungs: Coarse upper lobe sounds. Symmetrical chest expansion. Mechanically ventilated  Cardiovascular: rrr s1s2 no rgm cap refill brisk  Abdomen: Soft round ndnt + bowel sounds  Extremities: no acute joint deformity. No edema. No cyanosis Neuro: Pupils are 39m sluggish bilaterally. + cough. No response to pain GU: foley with yellow urine  Resolved Hospital Problem list     Assessment & Plan:   [facility] patient BIB EMS for AMS, suspected  OD + hypoxia and aspiration of gastric contents -TOC   Acute respiratory failure with hypoxia Aspiration PNA P -unasyn -cont MV support -WUA/SBT when appropriate -VAP, pulm hygiene  Acute metabolic encephalopathy, suspected drug overdose -Salicylate and APAP WNL -CT H CT C spine without acute findings  P -UDS -RASS goal 0 to -1  -not sure what he may have taken but will add a narcan gtt   Hypotension -- medication related after RSI and sedation, vs undifferentiated shock state P -cont IVF for now -decr sedation  Probable AKI (No baseline) P -IVF -trend renal indices, UOP   Mild transaminitis -send acute hepatitis panel  -APAP WNL  Hypokalemia Hypocalcemia  P -replace  Hyperglycemia -SSI -send A1c   Best Practice (right click and "Reselect all SmartList Selections" daily)   Diet/type: NPO DVT prophylaxis: prophylactic heparin  GI prophylaxis: PPI Lines: N/A Foley:  Yes, and it is still needed Code Status:  full code Last date of multidisciplinary goals of care discussion [--]  Labs   CBC: Recent Labs  Lab 07/09/21 1626 07/09/21 1630 07/09/21 1703  WBC 12.1*  --   --   NEUTROABS 7.4  --   --   HGB 16.0 16.7 14.3  HCT 48.5 49.0 42.0  MCV 88.0  --   --   PLT 396  --   --     Basic Metabolic Panel: Recent Labs  Lab 07/09/21  1626 07/09/21 1630 07/09/21 1703  NA 137 138 138  K 3.4* 3.3* 3.1*  CL 96* 97*  --   CO2 27  --   --   GLUCOSE 324* 325*  --   BUN 10 11  --   CREATININE 1.83* 1.70*  --   CALCIUM 8.6*  --   --    GFR: Estimated Creatinine Clearance: -3.9 mL/min (A) (by C-G formula based on SCr of 1.7 mg/dL (H)). Recent Labs  Lab 07/09/21 1626  WBC 12.1*    Liver Function Tests: Recent Labs  Lab 07/09/21 1626  AST 86*  ALT 77*  ALKPHOS 67  BILITOT 1.2  PROT 6.7  ALBUMIN 3.7   No results for input(s): "LIPASE", "AMYLASE" in the last 168 hours. No results for input(s): "AMMONIA" in the last 168 hours.  ABG     Component Value Date/Time   PHART 7.351 07/09/2021 1703   PCO2ART 53.8 (H) 07/09/2021 1703   PO2ART 167 (H) 07/09/2021 1703   HCO3 29.9 (H) 07/09/2021 1703   TCO2 32 07/09/2021 1703   O2SAT 99 07/09/2021 1703     Coagulation Profile: No results for input(s): "INR", "PROTIME" in the last 168 hours.  Cardiac Enzymes: Recent Labs  Lab 07/09/21 1626  CKTOTAL 182    HbA1C: No results found for: "HGBA1C"  CBG: No results for input(s): "GLUCAP" in the last 168 hours.  Review of Systems:   Unable to obtain intubated   Past Medical History:  [facility] Surgical History:   [facility]  Social History:    [facility]   Family History:  His family history is not on file.   Allergies Not on File   Home Medications  Prior to Admission medications   Not on File     Critical care time:      CRITICAL CARE Performed by: Cristal Generous   Total critical care time: 45 minutes  Critical care time was exclusive of separately billable procedures and treating other patients. Critical care was necessary to treat or prevent imminent or life-threatening deterioration.  Critical care was time spent personally by me on the following activities: development of treatment plan with patient and/or surrogate as well as nursing, discussions with consultants, evaluation of patient's response to treatment, examination of patient, obtaining history from patient or surrogate, ordering and performing treatments and interventions, ordering and review of laboratory studies, ordering and review of radiographic studies, pulse oximetry and re-evaluation of patient's condition.  Eliseo Gum MSN, AGACNP-BC [facility] for pager 07/09/2021, 5:53 PM

## 2021-07-09 NOTE — ED Provider Notes (Signed)
[facility] EMERGENCY DEPARTMENT Provider Note   CSN: [id number] Arrival date & time: 07/09/21  1613     History  No chief complaint on file.   Raymond Reynolds is a 56 y.o. male here with overdose.  Patient was found by a friend apneic and unresponsive.  His oxygen saturation was in the 60s and his respiration was 4.  Patient was thought to have an overdose and EMS gave him 4 mg of Narcan.  Patient started vomiting and they were unable to intubate the patient in route.  Patient is altered and unable to give me much history at all.   The history is provided by the EMS personnel.  Level V caveat- AMS      Home Medications Prior to Admission medications   Not on File      Allergies    Patient has no allergy information on record.    Review of Systems   Review of Systems  Psychiatric/Behavioral:  Positive for confusion.   All other systems reviewed and are negative.   Physical Exam Updated Vital Signs BP (!) 98/59   Pulse (!) 102   Resp 18   SpO2 98%  Physical Exam Vitals and nursing note reviewed.  Constitutional:      Comments: Altered and confused and vomiting  HENT:     Head: Normocephalic.     Nose: Nose normal.     Mouth/Throat:     Mouth: Mucous membranes are dry.  Eyes:     Comments: Pinpoint pupils bilaterally  Cardiovascular:     Rate and Rhythm: Regular rhythm. Tachycardia present.     Pulses: Normal pulses.     Heart sounds: Normal heart sounds.  Pulmonary:     Comments: Respirations around 10.  Patient has diminished breath sounds bilateral bases Abdominal:     General: Abdomen is flat.     Palpations: Abdomen is soft.  Musculoskeletal:        General: Normal range of motion.     Cervical back: Normal range of motion and neck supple.  Skin:    General: Skin is warm.     Capillary Refill: Capillary refill takes less than 2 seconds.  Neurological:     Comments: Patient is very sleepy unable to answer questions.  Patient is  moving all extremities spontaneously  Psychiatric:     Comments: Unable     ED Results / Procedures / Treatments   Labs (all labs ordered are listed, but only abnormal results are displayed) Labs Reviewed  I-STAT CHEM 8, ED - Abnormal; Notable for the following components:      Result Value   Potassium 3.3 (*)    Chloride 97 (*)    Creatinine, Ser 1.70 (*)    Glucose, Bld 325 (*)    Calcium, Ion 1.05 (*)    All other components within normal limits  CBC WITH DIFFERENTIAL/PLATELET  COMPREHENSIVE METABOLIC PANEL  ETHANOL  SALICYLATE LEVEL  ACETAMINOPHEN LEVEL  RAPID URINE DRUG SCREEN, HOSP PERFORMED  URINALYSIS, ROUTINE W REFLEX MICROSCOPIC  CK    EKG None  Radiology No results found.  Procedures Procedures    INTUBATION Performed by: Wandra Arthurs  Required items: required blood products, implants, devices, and special equipment available Patient identity confirmed: provided demographic data and hospital-assigned identification number Time out: Immediately prior to procedure a "time out" was called to verify the correct patient, procedure, equipment, support staff and site/side marked as required.  Indications: drug overdose, hypoxia  Intubation method: Direct Laryngoscopy   Preoxygenation: BVM  Sedatives: 20 mg Etomidate Paralytic: 100 mg rocuronium  Tube Size: 8.0 cuffed  Post-procedure assessment: chest rise and ETCO2 monitor Breath sounds: equal and absent over the epigastrium Tube secured with: ETT holder Chest x-ray interpreted by radiologist and me.  Chest x-ray findings: endotracheal tube in appropriate position  Patient tolerated the procedure well with no immediate complications.   CRITICAL CARE Performed by: Wandra Arthurs   Total critical care time: 30 minutes  Critical care time was exclusive of separately billable procedures and treating other patients.  Critical care was necessary to treat or prevent imminent or life-threatening  deterioration.  Critical care was time spent personally by me on the following activities: development of treatment plan with patient and/or surrogate as well as nursing, discussions with consultants, evaluation of patient's response to treatment, examination of patient, obtaining history from patient or surrogate, ordering and performing treatments and interventions, ordering and review of laboratory studies, ordering and review of radiographic studies, pulse oximetry and re-evaluation of patient's condition.    Medications Ordered in ED Medications  etomidate (AMIDATE) injection (20 mg Intravenous Given 07/09/21 1616)  rocuronium (ZEMURON) injection (100 mg Intravenous Given 07/09/21 1617)  propofol (DIPRIVAN) 1000 MG/100ML infusion (20 mcg/kg/min  80 kg (Order-Specific) Intravenous New Bag/Given 07/09/21 1619)  sodium chloride 0.9 % bolus 1,000 mL (1,000 mLs Intravenous New Bag/Given 07/09/21 1628)    ED Course/ Medical Decision Making/ A&P                           Medical Decision Making Raymond Reynolds is a 56 y.o. male here presenting with unresponsive.  Patient was given Narcan prior to arrival and remains apneic and actively vomiting.  He was also hypoxic despite being on nonrebreather.  He was not protecting his airway and I intubated him for airway protection.  Given that he was found down, will get CT head and neck and also get CBC and CMP and full tox level.    5:22 PM I reviewed patient's labs and independently reviewed his imaging studies.  Patient's ET tube and NG tubes are in place.  Patient has possible pneumonia both on the CT and x-ray.  Patient is sedated on propofol and became more hypotensive so we will switch to Versed.  Will order Unasyn for aspiration pneumonia. Critical care to admit   Problems Addressed: Acute respiratory failure with hypoxia [facility]): acute illness or injury Aspiration pneumonia of right lower lobe, unspecified aspiration pneumonia type [facility]): acute  illness or injury Drug overdose of undetermined intent, initial encounter: acute illness or injury  Amount and/or Complexity of Data Reviewed Labs: ordered. Decision-making details documented in ED Course. Radiology: ordered and independent interpretation performed. Decision-making details documented in ED Course. ECG/medicine tests: ordered and independent interpretation performed. Decision-making details documented in ED Course.  Risk Prescription drug management. Decision regarding hospitalization.    Final Clinical Impression(s) / ED Diagnoses Final diagnoses:  None    Rx / DC Orders ED Discharge Orders     None         Drenda Freeze, MD 07/09/21 1727

## 2021-07-09 NOTE — Progress Notes (Signed)
Pharmacy Antibiotic Note  Raymond Reynolds is a 56 y.o. male admitted on 07/09/2021 presenting apneic and unresponsive, intubated in ED and concern for aspiration.  Pharmacy has been consulted for Unasyn dosing.  Plan: Unasyn 3g IV every 6 hours Monitor renal function, LOT  Height: '6\' 1"'$  (185.4 cm) Weight: 95.3 kg (210 lb) IBW/kg (Calculated) : 79.9  Temp (24hrs), Avg:97.8 F (36.6 C), Min:97.5 F (36.4 C), Max:97.8 F (36.6 C)  Recent Labs  Lab 07/09/21 1626 07/09/21 1630  WBC 12.1*  --   CREATININE 1.83* 1.70*    Estimated Creatinine Clearance: -3.9 mL/min (A) (by C-G formula based on SCr of 1.7 mg/dL (H)).    Not on File  Raymond Reynolds, PharmD Clinical Pharmacist ED Pharmacist Phone # [phone] 07/09/2021 5:26 PM

## 2021-07-09 NOTE — Progress Notes (Signed)
Pt. Transported from ED 15 to 6K44 without complications.

## 2021-07-09 NOTE — Progress Notes (Signed)
Belongings present at bedside include sweat pants, gym shorts, boxers, socks, cellphone, ID, debit card, and $138.

## 2021-07-09 NOTE — ED Notes (Signed)
To CT now with RT

## 2021-07-09 NOTE — Progress Notes (Signed)
Pt transported to CT and back to ED 15 without any complications.

## 2021-07-10 ENCOUNTER — Inpatient Hospital Stay (HOSPITAL_COMMUNITY): Payer: No Typology Code available for payment source

## 2021-07-10 DIAGNOSIS — T50901D Poisoning by unspecified drugs, medicaments and biological substances, accidental (unintentional), subsequent encounter: Secondary | ICD-10-CM

## 2021-07-10 LAB — BASIC METABOLIC PANEL
Anion gap: 16 — ABNORMAL HIGH (ref 5–15)
BUN: 15 mg/dL (ref 6–20)
CO2: 25 mmol/L (ref 22–32)
Calcium: 9 mg/dL (ref 8.9–10.3)
Chloride: 103 mmol/L (ref 98–111)
Creatinine, Ser: 1.94 mg/dL — ABNORMAL HIGH (ref 0.61–1.24)
GFR, Estimated: 40 mL/min — ABNORMAL LOW (ref >60)
Glucose, Bld: 91 mg/dL (ref 70–99)
Potassium: 3.3 mmol/L — ABNORMAL LOW (ref 3.5–5.1)
Sodium: 144 mmol/L (ref 135–145)

## 2021-07-10 LAB — HEPATIC FUNCTION PANEL
ALT: 65 U/L — ABNORMAL HIGH (ref 0–44)
AST: 48 U/L — ABNORMAL HIGH (ref 15–41)
Albumin: 3 g/dL — ABNORMAL LOW (ref 3.5–5.0)
Alkaline Phosphatase: 50 U/L (ref 38–126)
Bilirubin, Direct: 0.3 mg/dL — ABNORMAL HIGH (ref 0.0–0.2)
Indirect Bilirubin: 1.4 mg/dL — ABNORMAL HIGH (ref 0.3–0.9)
Total Bilirubin: 1.7 mg/dL — ABNORMAL HIGH (ref 0.3–1.2)
Total Protein: 5.4 g/dL — ABNORMAL LOW (ref 6.5–8.1)

## 2021-07-10 LAB — GLUCOSE, CAPILLARY
Glucose-Capillary: 69 mg/dL — ABNORMAL LOW (ref 70–99)
Glucose-Capillary: 97 mg/dL (ref 70–99)
Glucose-Capillary: 88 mg/dL (ref 70–99)
Glucose-Capillary: 113 mg/dL — ABNORMAL HIGH (ref 70–99)
Glucose-Capillary: 68 mg/dL — ABNORMAL LOW (ref 70–99)
Glucose-Capillary: 81 mg/dL (ref 70–99)
Glucose-Capillary: 47 mg/dL — ABNORMAL LOW (ref 70–99)
Glucose-Capillary: 72 mg/dL (ref 70–99)
Glucose-Capillary: 51 mg/dL — ABNORMAL LOW (ref 70–99)

## 2021-07-10 LAB — CBC
HCT: 40.4 % (ref 39.0–52.0)
Hemoglobin: 13.8 g/dL (ref 13.0–17.0)
MCH: 29.1 pg (ref 26.0–34.0)
MCHC: 34.2 g/dL (ref 30.0–36.0)
MCV: 85.2 fL (ref 80.0–100.0)
Platelets: 281 10*3/uL (ref 150–400)
RBC: 4.74 MIL/uL (ref 4.22–5.81)
RDW: 13.2 % (ref 11.5–15.5)
WBC: 13.5 10*3/uL — ABNORMAL HIGH (ref 4.0–10.5)
nRBC: 0 % (ref 0.0–0.2)

## 2021-07-10 LAB — TRIGLYCERIDES: Triglycerides: 132 mg/dL (ref <150)

## 2021-07-10 LAB — LACTIC ACID, PLASMA
Lactic Acid, Venous: 4 mmol/L (ref 0.5–1.9)
Lactic Acid, Venous: 3.2 mmol/L (ref 0.5–1.9)

## 2021-07-10 LAB — MRSA NEXT GEN BY PCR, NASAL: MRSA by PCR Next Gen: DETECTED — AB

## 2021-07-10 LAB — MAGNESIUM: Magnesium: 1.8 mg/dL (ref 1.7–2.4)

## 2021-07-10 MED ORDER — LACTATED RINGERS IV BOLUS
1000.0000 mL | Freq: Once | INTRAVENOUS | Status: AC
Start: 2021-07-10 — End: 2021-07-10
  Administered 2021-07-10: 1000 mL via INTRAVENOUS

## 2021-07-10 MED ORDER — MUPIROCIN 2 % EX OINT
1.0000 | TOPICAL_OINTMENT | Freq: Two times a day (BID) | CUTANEOUS | Status: DC
  Administered 2021-07-10 – 2021-07-14 (×8): 1 via NASAL
  Filled 2021-07-10 (×2): qty 22

## 2021-07-10 MED ORDER — POTASSIUM CHLORIDE 20 MEQ PO PACK
40.0000 meq | PACK | Freq: Once | ORAL | Status: AC
  Administered 2021-07-10: 40 meq
  Filled 2021-07-10: qty 2

## 2021-07-10 MED ORDER — DEXTROSE 50 % IV SOLN
25.0000 mL | Freq: Once | INTRAVENOUS | Status: AC
  Administered 2021-07-10: 25 mL via INTRAVENOUS

## 2021-07-10 MED ORDER — DEXTROSE 50 % IV SOLN
1.0000 | Freq: Once | INTRAVENOUS | Status: AC
  Administered 2021-07-10: 50 mL via INTRAVENOUS

## 2021-07-10 MED ORDER — LINEZOLID 600 MG/300ML IV SOLN
600.0000 mg | Freq: Two times a day (BID) | INTRAVENOUS | Status: DC
  Administered 2021-07-10 – 2021-07-11 (×4): 600 mg via INTRAVENOUS
  Filled 2021-07-10 (×6): qty 300

## 2021-07-10 MED ORDER — DEXTROSE 50 % IV SOLN: 12.5000 g | Freq: Once | INTRAVENOUS | Status: AC

## 2021-07-10 MED ORDER — DEXTROSE 50 % IV SOLN
INTRAVENOUS | Status: AC
  Administered 2021-07-10: 12.5 g via INTRAVENOUS
  Filled 2021-07-10: qty 50

## 2021-07-10 MED ORDER — DEXTROSE 50 % IV SOLN
INTRAVENOUS | Status: AC
  Filled 2021-07-10: qty 50

## 2021-07-10 MED ORDER — PROSOURCE TF PO LIQD: 45.0000 mL | Freq: Two times a day (BID) | ORAL | Status: DC

## 2021-07-10 MED ORDER — DEXTROSE 50 % IV SOLN
INTRAVENOUS | Status: AC
  Administered 2021-07-10: 50 mL via INTRAVENOUS
  Filled 2021-07-10: qty 50

## 2021-07-10 MED ORDER — DEXTROSE 50 % IV SOLN: 1.0000 | INTRAVENOUS | Status: AC

## 2021-07-10 MED ORDER — VITAL AF 1.2 CAL PO LIQD
1000.0000 mL | ORAL | Status: DC
  Administered 2021-07-10: 1000 mL

## 2021-07-10 NOTE — Progress Notes (Signed)
NAMEMakih Reynolds, MRN:  [id number], DOB:  31-Aug-1965, LOS: 1 ADMISSION DATE:  07/09/2021, CONSULTATION DATE:  07/09/21 REFERRING MD:  Dahlia Bailiff CHIEF COMPLAINT:  AMS   History of Present Illness:  66M who was BIB EMS after being found unresponsive and essentially apneic, and hypoxic. He received '4mg'$  narcan with EMS and began vomiting and remained minimally responsive. EMS attempted to place a [facility] for airway protection but was not successful. He arrived to ED on NRB with emesis in oropharynx and nasopharynx. He was intubated by ED      PCCM is consulted for admission in this setting   Pertinent  Medical History    Significant Hospital Events: Including procedures, antibiotic start and stop dates in addition to other pertinent events   6/25 Raymond Reynolds suspected OD. Intubated   Interim History / Subjective:   No acute events overnight He has intermittent episodes of agitation, not following commands Remains intubated Had further vomiting episodes overnight  Objective   Blood pressure 105/66, pulse 81, temperature 99.9 F (37.7 C), temperature source Bladder, resp. rate (!) 22, height '6\' 1"'$  (1.854 m), weight 92.9 kg, SpO2 100 %.    Vent Mode: PRVC FiO2 (%):  [40 %-100 %] 40 % Set Rate:  [18 bmp-22 bmp] 22 bmp Vt Set:  [570 mL-630 mL] 630 mL PEEP:  [5 cmH20] 5 cmH20 Plateau Pressure:  [17 cmH20-23 cmH20] 19 cmH20   Intake/Output Summary (Last 24 hours) at 07/10/2021 0998 Last data filed at 07/10/2021 0800 Gross per 24 hour  Intake 3896.02 ml  Output 385 ml  Net 3511.02 ml   Filed Weights   07/09/21 1700 07/10/21 0500  Weight: 95.3 kg 92.9 kg   Examination: General: middle aged male, no acute distress, intubated, sedated HENT: [place]/AT, sclera anicteric, moist mucous membranes Lungs: course breath sounds, no wheezing Cardiovascular: rrr, no murmurs Abdomen: diminished bowel sounds, firm abdomen, mildly distended Extremities: no edema, warm Neuro: moving all extremities  spontaneously, not following commands, alert to verbal stimuli GU: foley  Resolved Hospital Problem list     Assessment & Plan:  Acute Hypoxemic Respiratory Arrest Aspiration Pneumonia - continue mechanical ventilator support - continue unasyn. Will add Linezolid for MRSA coverage based on +MRSA screen.  - continue sedation with propofol and PRN fentanyl/versed - he remains too encephalopathic for extubation   Acute Metabolic Encephalopathy due to drug use (benzos, cocaine) - Head CT 6/25 negative - Supportive Care  Suspected Acute Kidney Injury vs CKD Anion Gap Metabolic Acidosis Lactic Acidosis - Unknown baseline Cr - Monitor Cr and UOP - Kidney injury in setting or relative hypotension and hypoxia - salicylate level negative on admission - repeat LA level  Abdominal Distention Vomiting - check KUB - Consider abdominal CT scan based on KUB results  Elevated Liver Enzymes Elevated Bilirubin - Continue to Monitor, enzymes trending down  Best Practice (right click and "Reselect all SmartList Selections" daily)   Diet/type: NPO DVT prophylaxis: prophylactic heparin  GI prophylaxis: PPI Lines: N/A Foley:  Yes, and it is no longer needed Code Status:  full code Last date of multidisciplinary goals of care discussion [Attempted to Call patient's wife [name] x 2, [phone], with no answer]  Labs   CBC: Recent Labs  Lab 07/09/21 1626 07/09/21 1630 07/09/21 1703 07/10/21 0620  WBC 12.1*  --   --  13.5*  NEUTROABS 7.4  --   --   --   HGB 16.0 16.7 14.3 13.8  HCT 48.5 49.0 42.0 40.4  MCV 88.0  --   --  85.2  PLT 396  --   --  761    Basic Metabolic Panel: Recent Labs  Lab 07/09/21 1626 07/09/21 1630 07/09/21 1703 07/10/21 0620  NA 137 138 138 144  K 3.4* 3.3* 3.1* 3.3*  CL 96* 97*  --  103  CO2 27  --   --  25  GLUCOSE 324* 325*  --  91  BUN 10 11  --  15  CREATININE 1.83* 1.70*  --  1.94*  CALCIUM 8.6*  --   --  9.0  MG  --   --   --  1.8    GFR: Estimated Creatinine Clearance: 49.2 mL/min (A) (by C-G formula based on SCr of 1.94 mg/dL (H)). Recent Labs  Lab 07/09/21 1626 07/09/21 1842 07/09/21 2009 07/10/21 0620  WBC 12.1*  --   --  13.5*  LATICACIDVEN  --  2.7* 4.5*  --     Liver Function Tests: Recent Labs  Lab 07/09/21 1626 07/10/21 0620  AST 86* 48*  ALT 77* 65*  ALKPHOS 67 50  BILITOT 1.2 1.7*  PROT 6.7 5.4*  ALBUMIN 3.7 3.0*   No results for input(s): "LIPASE", "AMYLASE" in the last 168 hours. No results for input(s): "AMMONIA" in the last 168 hours.  ABG    Component Value Date/Time   PHART 7.351 07/09/2021 1703   PCO2ART 53.8 (H) 07/09/2021 1703   PO2ART 167 (H) 07/09/2021 1703   HCO3 29.9 (H) 07/09/2021 1703   TCO2 32 07/09/2021 1703   O2SAT 99 07/09/2021 1703     Coagulation Profile: No results for input(s): "INR", "PROTIME" in the last 168 hours.  Cardiac Enzymes: Recent Labs  Lab 07/09/21 1626  CKTOTAL 182    HbA1C: Hgb A1c MFr Bld  Date/Time Value Ref Range Status  07/09/2021 06:42 PM 5.5 4.8 - 5.6 % Final    Comment:    (NOTE) Pre diabetes:          5.7%-6.4%  Diabetes:              >6.4%  Glycemic control for   <7.0% adults with diabetes     CBG: Recent Labs  Lab 07/09/21 2301 07/09/21 2343 07/10/21 0326 07/10/21 0418 07/10/21 0725  GLUCAP 58* 135* 69* 97 88     Critical care time: 45 minutes    Freda Jackson, MD [facility] Office: [phone]   See Amion for personal pager PCCM on call pager [phone] until 7pm. Please call Elink 7p-7a. [phone]

## 2021-07-10 NOTE — Progress Notes (Signed)
Initial Nutrition Assessment  DOCUMENTATION CODES:   Not applicable  INTERVENTION:   Initiate tube feeding via OG tube: Vital AF 1.2 at 25 ml/h, increase by 10 ml every 4 hours to goal rate of 75 ml/h (1800 ml per day)  Provides 2160 kcal (2741 kcal total with current propofol), 135 gm protein, 1460 ml free water daily.  NUTRITION DIAGNOSIS:   Inadequate oral intake related to inability to eat as evidenced by NPO status.  GOAL:   Patient will meet greater than or equal to 90% of their needs  MONITOR:   Vent status, TF tolerance, Labs  REASON FOR ASSESSMENT:   Ventilator, Consult Enteral/tube feeding initiation and management  ASSESSMENT:   56 yo male admitted after being found down, suspected overdose, AKI. Intubated in the ED. No past medical history on file.  6/25 - head CT negative  Discussed patient in ICU rounds and with RN today. Patient had some vomiting on admission and further vomiting episodes last night.  KUB this morning showed no bowel dilation/obstruction.  Received MD Consult for TF initiation and management. OG tube in place with tip in the stomach per x-ray.  Patient is currently intubated on ventilator support MV: 13.5 L/min Temp (24hrs), Avg:98.4 F (36.9 C), Min:97 F (36.1 C), Max:100.2 F (37.9 C)  Propofol: 22 ml/hr providing 581 kcal per day  Labs reviewed. K 3.3 CBG: [phone]  Medications reviewed and include Colace, Novolog, Protonix, Miralax, propofol. IVF: LR at 100 ml/h  No weight hx available for review.   NUTRITION - FOCUSED PHYSICAL EXAM:  Flowsheet Row Most Recent Value  Orbital Region No depletion  Upper Arm Region No depletion  Thoracic and Lumbar Region No depletion  Buccal Region Unable to assess  Temple Region No depletion  Clavicle Bone Region No depletion  Clavicle and Acromion Bone Region No depletion  Scapular Bone Region No depletion  Dorsal Hand No depletion  Patellar Region No depletion  Anterior  Thigh Region No depletion  Posterior Calf Region No depletion  Edema (RD Assessment) None  Hair Reviewed  Eyes Unable to assess  Mouth Unable to assess  Skin Reviewed  Nails Reviewed       Diet Order:   Diet Order             Diet NPO time specified  Diet effective now                   EDUCATION NEEDS:   No education needs have been identified at this time  Skin:  Skin Assessment: Reviewed RN Assessment  Last BM:  no BM documented  Height:   Ht Readings from Last 1 Encounters:  07/09/21 '6\' 1"'$  (1.854 m)    Weight:   Wt Readings from Last 1 Encounters:  07/10/21 92.9 kg    Ideal Body Weight:  83.6 kg  BMI:  Body mass index is 27.02 kg/m.  Estimated Nutritional Needs:   Kcal:  2200-2400  Protein:  120-140 gm  Fluid:  2.2-2.4 L    Lucas Mallow RD, LDN, CNSC Please refer to Amion for contact information.

## 2021-07-10 NOTE — Progress Notes (Signed)
Pharmacy Antibiotic Note  Raymond Reynolds is a 56 y.o. male admitted on 07/09/2021 with pneumonia.  Pharmacy has been consulted for Linezolid dosing.  Plan: Start Linezolid 600 mg IV q12hr Monitor cultures to determine sensitivities  Height: '6\' 1"'$  (185.4 cm) Weight: 92.9 kg (204 lb 12.9 oz) IBW/kg (Calculated) : 79.9  Temp (24hrs), Avg:98.3 F (36.8 C), Min:97 F (36.1 C), Max:100.2 F (37.9 C)  Recent Labs  Lab 07/09/21 1626 07/09/21 1630 07/09/21 1842 07/09/21 2009 07/10/21 0620  WBC 12.1*  --   --   --  13.5*  CREATININE 1.83* 1.70*  --   --  1.94*  LATICACIDVEN  --   --  2.7* 4.5*  --     Estimated Creatinine Clearance: 49.2 mL/min (A) (by C-G formula based on SCr of 1.94 mg/dL (H)).    Not on File  Antimicrobials this admission: Unasyn 3/25 >>  Zyvox 3/26 >>   3/25 MRSA PCR: positive  Thank you for allowing pharmacy to be a part of this patient's care.  Alanda Slim, PharmD, [facility] Clinical Pharmacist Please see AMION for all Pharmacists' Contact Phone Numbers 07/10/2021, 9:32 AM

## 2021-07-11 LAB — GLUCOSE, CAPILLARY
Glucose-Capillary: 115 mg/dL — ABNORMAL HIGH (ref 70–99)
Glucose-Capillary: 116 mg/dL — ABNORMAL HIGH (ref 70–99)
Glucose-Capillary: 118 mg/dL — ABNORMAL HIGH (ref 70–99)
Glucose-Capillary: 156 mg/dL — ABNORMAL HIGH (ref 70–99)
Glucose-Capillary: 57 mg/dL — ABNORMAL LOW (ref 70–99)
Glucose-Capillary: 62 mg/dL — ABNORMAL LOW (ref 70–99)
Glucose-Capillary: 84 mg/dL (ref 70–99)
Glucose-Capillary: 98 mg/dL (ref 70–99)

## 2021-07-11 LAB — COMPREHENSIVE METABOLIC PANEL
ALT: 45 U/L — ABNORMAL HIGH (ref 0–44)
AST: 33 U/L (ref 15–41)
Albumin: 2.9 g/dL — ABNORMAL LOW (ref 3.5–5.0)
Alkaline Phosphatase: 50 U/L (ref 38–126)
Anion gap: 10 (ref 5–15)
BUN: 15 mg/dL (ref 6–20)
CO2: 23 mmol/L (ref 22–32)
Calcium: 8.6 mg/dL — ABNORMAL LOW (ref 8.9–10.3)
Chloride: 108 mmol/L (ref 98–111)
Creatinine, Ser: 1.93 mg/dL — ABNORMAL HIGH (ref 0.61–1.24)
GFR, Estimated: 41 mL/min — ABNORMAL LOW (ref >60)
Glucose, Bld: 83 mg/dL (ref 70–99)
Potassium: 2.8 mmol/L — ABNORMAL LOW (ref 3.5–5.1)
Sodium: 141 mmol/L (ref 135–145)
Total Bilirubin: 1.1 mg/dL (ref 0.3–1.2)
Total Protein: 5.4 g/dL — ABNORMAL LOW (ref 6.5–8.1)

## 2021-07-11 LAB — CBC WITH DIFFERENTIAL/PLATELET
Abs Immature Granulocytes: 0.04 10*3/uL (ref 0.00–0.07)
Basophils Absolute: 0 10*3/uL (ref 0.0–0.1)
Basophils Relative: 0 %
Eosinophils Absolute: 0.5 10*3/uL (ref 0.0–0.5)
Eosinophils Relative: 5 %
HCT: 43.3 % (ref 39.0–52.0)
Hemoglobin: 14 g/dL (ref 13.0–17.0)
Immature Granulocytes: 0 %
Lymphocytes Relative: 14 %
Lymphs Abs: 1.5 10*3/uL (ref 0.7–4.0)
MCH: 27.9 pg (ref 26.0–34.0)
MCHC: 32.3 g/dL (ref 30.0–36.0)
MCV: 86.3 fL (ref 80.0–100.0)
Monocytes Absolute: 1 10*3/uL (ref 0.1–1.0)
Monocytes Relative: 9 %
Neutro Abs: 7.9 10*3/uL — ABNORMAL HIGH (ref 1.7–7.7)
Neutrophils Relative %: 72 %
Platelets: 247 10*3/uL (ref 150–400)
RBC: 5.02 MIL/uL (ref 4.22–5.81)
RDW: 13.9 % (ref 11.5–15.5)
WBC: 11.1 10*3/uL — ABNORMAL HIGH (ref 4.0–10.5)
nRBC: 0 % (ref 0.0–0.2)

## 2021-07-11 LAB — PROCALCITONIN: Procalcitonin: 0.64 ng/mL

## 2021-07-11 LAB — AMMONIA: Ammonia: 31 umol/L (ref 9–35)

## 2021-07-11 LAB — LIPASE, BLOOD: Lipase: 25 U/L (ref 11–51)

## 2021-07-11 LAB — POTASSIUM: Potassium: 4.1 mmol/L (ref 3.5–5.1)

## 2021-07-11 MED ORDER — DEXTROSE 50 % IV SOLN
INTRAVENOUS | Status: AC
  Administered 2021-07-11: 25 mL
  Filled 2021-07-11: qty 50

## 2021-07-11 MED ORDER — POTASSIUM CHLORIDE 20 MEQ PO PACK
40.0000 meq | PACK | ORAL | Status: DC
Start: 2021-07-12 — End: 2021-07-12

## 2021-07-11 MED ORDER — DEXMEDETOMIDINE HCL IN NACL 400 MCG/100ML IV SOLN
INTRAVENOUS | Status: AC
  Administered 2021-07-11: 0.4 ug/kg/h via INTRAVENOUS
  Filled 2021-07-11: qty 100

## 2021-07-11 MED ORDER — MAGNESIUM SULFATE 2 GM/50ML IV SOLN
2.0000 g | Freq: Once | INTRAVENOUS | Status: AC
  Administered 2021-07-11: 2 g via INTRAVENOUS
  Filled 2021-07-11: qty 50

## 2021-07-11 MED ORDER — DEXTROSE 50 % IV SOLN
INTRAVENOUS | Status: AC
  Administered 2021-07-11: 50 mL
  Filled 2021-07-11: qty 50

## 2021-07-11 MED ORDER — POTASSIUM CHLORIDE 20 MEQ PO PACK
40.0000 meq | PACK | Freq: Once | ORAL | Status: AC
  Administered 2021-07-11: 40 meq
  Filled 2021-07-11: qty 2

## 2021-07-11 MED ORDER — POTASSIUM CHLORIDE 10 MEQ/100ML IV SOLN
10.0000 meq | INTRAVENOUS | Status: AC
  Administered 2021-07-11 (×4): 10 meq via INTRAVENOUS
  Filled 2021-07-11 (×4): qty 100

## 2021-07-11 MED ORDER — DEXTROSE 10 % IV SOLN: INTRAVENOUS | Status: DC

## 2021-07-11 MED ORDER — ORAL CARE MOUTH RINSE: 15.0000 mL | OROMUCOSAL | Status: DC | PRN

## 2021-07-11 MED ORDER — DEXMEDETOMIDINE HCL IN NACL 400 MCG/100ML IV SOLN
0.4000 ug/kg/h | INTRAVENOUS | Status: DC
  Administered 2021-07-11 (×3): 0.4 ug/kg/h via INTRAVENOUS
  Filled 2021-07-11: qty 100

## 2021-07-11 NOTE — Plan of Care (Signed)

## 2021-07-11 NOTE — Progress Notes (Signed)
[facility] Progress Note Patient Name: Jedrick Hutcherson DOB: [birth year] MRN: [id number]   Date of Service  07/11/2021  HPI/Events of Note  Hypoglycemia - 2 episodes with blood glucose = 47  and 51.  eICU Interventions  Plan: D10W IV infusion at 40 mL/hour.     Intervention Category Major Interventions: Other:  Lysle Dingwall 07/11/2021, 12:28 AM

## 2021-07-11 NOTE — Procedures (Signed)
Extubation Procedure Note  Patient Details:   Name: Raymond Reynolds DOB: 1966/01/14 MRN: [id number]   Airway Documentation:    Vent end date: 07/11/21 Vent end time: 1110   Evaluation  O2 sats: stable throughout Complications: No apparent complications Patient did tolerate procedure well. Bilateral Breath Sounds: Clear, Diminished   Yes  Pt was suctioned orally and via ETT. RN at bedside, audible cuff leak was heard. Pt was extubated 4L [place]. No stridor noted. Pt was able to speak and say name with no apparent complications. RT will continue to monitor.   Brenton Grills 07/11/2021, 11:17 AM

## 2021-07-11 NOTE — Progress Notes (Signed)
   NAMEMcclain Shall, MRN:  [id number], DOB:  1965-04-24, LOS: 2 ADMISSION DATE:  07/09/2021, CONSULTATION DATE:  07/09/21 REFERRING MD:  Dahlia Bailiff CHIEF COMPLAINT:  AMS   History of Present Illness:  50M who was BIB EMS after being found unresponsive and essentially apneic, and hypoxic. He received '4mg'$  narcan with EMS and began vomiting and remained minimally responsive. EMS attempted to place a [facility] for airway protection but was not successful. He arrived to ED on NRB with emesis in oropharynx and nasopharynx. He was intubated by ED      PCCM is consulted for admission in this setting   Pertinent  Medical History    Significant Hospital Events: Including procedures, antibiotic start and stop dates in addition to other pertinent events   6/25 Raymond Reynolds suspected OD. Intubated   Interim History / Subjective:  Doing better starting to follow commands. Wife at bedside.  Objective   Blood pressure 136/78, pulse 74, temperature 99.1 F (37.3 C), temperature source Axillary, resp. rate (!) 22, height '6\' 1"'$  (1.854 m), weight 96.2 kg, SpO2 100 %.    Vent Mode: PRVC FiO2 (%):  [40 %] 40 % Set Rate:  [22 bmp] 22 bmp Vt Set:  [630 mL] 630 mL PEEP:  [5 cmH20] 5 cmH20 Plateau Pressure:  [19 cmH20-21 cmH20] 21 cmH20   Intake/Output Summary (Last 24 hours) at 07/11/2021 0758 Last data filed at 07/11/2021 0600 Gross per 24 hour  Intake 3373.01 ml  Output 1270 ml  Net 2103.01 ml    Filed Weights   07/09/21 1700 07/10/21 0500 07/11/21 0500  Weight: 95.3 kg 92.9 kg 96.2 kg   Examination: Diaphoretic, agitated moving around in bed but redirectable Heartrate borderline tachy, ext warm Lungs clear, triggering vent Moves all 4 ext to command Abdomen firm but +BS Trace anasarca  Hypoglycemic overnight requiring d10 gtt Cr stable CBC stable No new imaging  Resolved Hospital Problem list     Assessment & Plan:  Acute hypoxemic respiratory failure- with likely aspiration pneumonitis Acute  metabolic encephalopathy due to polysubstance abuse- improving with supportive care Acute liver injury- question related to drug abuse, improving AKI vs. Advancing CKD- Cr in 2021 was 1.15, currently 1.9, oliguric at present N/V- check lipase, KUB benign Hx GERD, HTN, allergies, DJD Hypo-K  - Switch prop to precedex, wean to extubate then work on progressive mobility - Continue D10 then hopefully can wean to PO - Check ammonia and lipase for completeness' sake - CM consult for substance abuse resources - Currently on linezolid, unasyn, check Pct, DC if < 0.2.  Otherwise would tx for 5 days as low suspicion for MRSA PNA. - Replete K, check afternoon level - Wife updated at bedside.  Best Practice (right click and "Reselect all SmartList Selections" daily)   Diet/type: TF off, hoping to extubate DVT prophylaxis: prophylactic heparin  GI prophylaxis: PPI Lines: N/A Foley:  Yes, and it is no longer needed Code Status:  full code Last date of multidisciplinary goals of care discussion [Updated at bedside]  34 min cc time

## 2021-07-11 NOTE — Progress Notes (Signed)
[facility] ADULT ICU REPLACEMENT PROTOCOL   The patient does apply for the [facility] Adult ICU Electrolyte Replacment Protocol based on the criteria listed below:   1.Exclusion criteria: TCTS patients, ECMO patients, and Dialysis patients 2. Is GFR >/= 30 ml/min? Yes.    Patient's GFR today is 41 3. Is SCr </= 2? Yes.   Patient's SCr is 1.93 mg/dL 4. Did SCr increase >/= 0.5 in 24 hours? No. 5.Pt's weight >40kg  Yes.   6. Abnormal electrolyte(s): K+ 2.9  7. Electrolytes replaced per protocol 8.  Call MD STAT for K+ </= 2.5, Phos </= 1, or Mag </= 1 Physician:  n/a  Raymond Reynolds 07/11/2021 4:17 AM

## 2021-07-12 LAB — CBC WITH DIFFERENTIAL/PLATELET
Abs Immature Granulocytes: 0.05 10*3/uL (ref 0.00–0.07)
Basophils Absolute: 0 10*3/uL (ref 0.0–0.1)
Basophils Relative: 0 %
Eosinophils Absolute: 0 10*3/uL (ref 0.0–0.5)
Eosinophils Relative: 0 %
HCT: 42.1 % (ref 39.0–52.0)
Hemoglobin: 14.2 g/dL (ref 13.0–17.0)
Immature Granulocytes: 0 %
Lymphocytes Relative: 6 %
Lymphs Abs: 0.8 10*3/uL (ref 0.7–4.0)
MCH: 29.4 pg (ref 26.0–34.0)
MCHC: 33.7 g/dL (ref 30.0–36.0)
MCV: 87.2 fL (ref 80.0–100.0)
Monocytes Absolute: 1 10*3/uL (ref 0.1–1.0)
Monocytes Relative: 7 %
Neutro Abs: 11.8 10*3/uL — ABNORMAL HIGH (ref 1.7–7.7)
Neutrophils Relative %: 87 %
Platelets: 263 10*3/uL (ref 150–400)
RBC: 4.83 MIL/uL (ref 4.22–5.81)
RDW: 14.2 % (ref 11.5–15.5)
WBC: 13.6 10*3/uL — ABNORMAL HIGH (ref 4.0–10.5)
nRBC: 0 % (ref 0.0–0.2)

## 2021-07-12 LAB — BASIC METABOLIC PANEL
Anion gap: 11 (ref 5–15)
BUN: 8 mg/dL (ref 6–20)
CO2: 26 mmol/L (ref 22–32)
Calcium: 8.5 mg/dL — ABNORMAL LOW (ref 8.9–10.3)
Chloride: 103 mmol/L (ref 98–111)
Creatinine, Ser: 1.17 mg/dL (ref 0.61–1.24)
GFR, Estimated: >60 mL/min (ref >60)
Glucose, Bld: 112 mg/dL — ABNORMAL HIGH (ref 70–99)
Potassium: 4.2 mmol/L (ref 3.5–5.1)
Sodium: 140 mmol/L (ref 135–145)

## 2021-07-12 LAB — GLUCOSE, CAPILLARY
Glucose-Capillary: 132 mg/dL — ABNORMAL HIGH (ref 70–99)
Glucose-Capillary: 102 mg/dL — ABNORMAL HIGH (ref 70–99)
Glucose-Capillary: 90 mg/dL (ref 70–99)
Glucose-Capillary: 110 mg/dL — ABNORMAL HIGH (ref 70–99)
Glucose-Capillary: 100 mg/dL — ABNORMAL HIGH (ref 70–99)
Glucose-Capillary: 88 mg/dL (ref 70–99)

## 2021-07-12 LAB — PHOSPHORUS: Phosphorus: 4.5 mg/dL (ref 2.5–4.6)

## 2021-07-12 LAB — PROCALCITONIN: Procalcitonin: 0.29 ng/mL

## 2021-07-12 LAB — MAGNESIUM: Magnesium: 2.4 mg/dL (ref 1.7–2.4)

## 2021-07-12 MED ORDER — SENNA 8.6 MG PO TABS
1.0000 | ORAL_TABLET | Freq: Every day | ORAL | Status: DC
  Administered 2021-07-13 – 2021-07-14 (×2): 8.6 mg via ORAL
  Filled 2021-07-12 (×3): qty 1

## 2021-07-12 MED ORDER — METHOCARBAMOL 750 MG PO TABS
750.0000 mg | ORAL_TABLET | Freq: Every evening | ORAL | Status: DC | PRN
Start: 2021-07-12 — End: 2021-07-14
  Administered 2021-07-13: 750 mg via ORAL
  Filled 2021-07-12: qty 1

## 2021-07-12 MED ORDER — CARVEDILOL 6.25 MG PO TABS
6.2500 mg | ORAL_TABLET | Freq: Two times a day (BID) | ORAL | Status: DC
  Administered 2021-07-12 – 2021-07-14 (×4): 6.25 mg via ORAL
  Filled 2021-07-12 (×4): qty 1

## 2021-07-12 MED ORDER — TRAZODONE HCL 50 MG PO TABS
50.0000 mg | ORAL_TABLET | Freq: Every day | ORAL | Status: DC
  Administered 2021-07-12 – 2021-07-13 (×2): 50 mg via ORAL
  Filled 2021-07-12 (×2): qty 1

## 2021-07-12 MED ORDER — ACETAMINOPHEN 325 MG PO TABS
650.0000 mg | ORAL_TABLET | Freq: Four times a day (QID) | ORAL | Status: DC | PRN
  Administered 2021-07-12 – 2021-07-14 (×6): 650 mg via ORAL
  Filled 2021-07-12 (×6): qty 2

## 2021-07-12 MED ORDER — DICLOFENAC SODIUM 1 % EX GEL
2.0000 g | Freq: Three times a day (TID) | CUTANEOUS | Status: DC
Start: 2021-07-12 — End: 2021-07-14
  Administered 2021-07-12 – 2021-07-14 (×5): 2 g via TOPICAL
  Filled 2021-07-12: qty 100

## 2021-07-12 MED ORDER — FLUTICASONE PROPIONATE 50 MCG/ACT NA SUSP
1.0000 | Freq: Two times a day (BID) | NASAL | Status: DC
  Administered 2021-07-12 – 2021-07-14 (×5): 1 via NASAL
  Filled 2021-07-12: qty 16

## 2021-07-12 MED ORDER — POLYETHYLENE GLYCOL 3350 17 G PO PACK
17.0000 g | PACK | Freq: Every day | ORAL | Status: DC
  Administered 2021-07-13 – 2021-07-14 (×2): 17 g via ORAL
  Filled 2021-07-12 (×2): qty 1

## 2021-07-12 MED ORDER — AMLODIPINE BESYLATE 10 MG PO TABS
10.0000 mg | ORAL_TABLET | Freq: Every day | ORAL | Status: DC
  Administered 2021-07-12 – 2021-07-14 (×3): 10 mg via ORAL
  Filled 2021-07-12 (×3): qty 1

## 2021-07-12 MED ORDER — IPRATROPIUM BROMIDE 0.06 % NA SOLN: 2.0000 | Freq: Four times a day (QID) | NASAL | Status: DC | PRN

## 2021-07-12 MED ORDER — DOCUSATE SODIUM 100 MG PO CAPS
100.0000 mg | ORAL_CAPSULE | Freq: Two times a day (BID) | ORAL | Status: DC
  Administered 2021-07-12 – 2021-07-14 (×4): 100 mg via ORAL
  Filled 2021-07-12 (×5): qty 1

## 2021-07-12 MED ORDER — AMOXICILLIN-POT CLAVULANATE 875-125 MG PO TABS
1.0000 | ORAL_TABLET | Freq: Two times a day (BID) | ORAL | Status: DC
Start: 2021-07-12 — End: 2021-07-14
  Administered 2021-07-12 – 2021-07-14 (×5): 1 via ORAL
  Filled 2021-07-12 (×5): qty 1

## 2021-07-12 MED ORDER — LEVOBUNOLOL HCL 0.5 % OP SOLN
1.0000 [drp] | Freq: Every morning | OPHTHALMIC | Status: DC
Start: 2021-07-12 — End: 2021-07-14
  Administered 2021-07-12 – 2021-07-14 (×2): 1 [drp] via OPHTHALMIC
  Filled 2021-07-12 (×2): qty 5

## 2021-07-12 MED ORDER — FAMOTIDINE 20 MG PO TABS
40.0000 mg | ORAL_TABLET | Freq: Every day | ORAL | Status: DC
  Administered 2021-07-12 – 2021-07-13 (×2): 40 mg via ORAL
  Filled 2021-07-12 (×3): qty 2

## 2021-07-12 NOTE — Evaluation (Signed)
Occupational Therapy Evaluation Patient Details Name: Raymond Reynolds MRN: [id number] DOB: 29-Dec-1965 Today's Date: 07/12/2021   History of Present Illness pt is a 56 yo male brought by EMS to ED 6/25 after being found unresponsive and essentially apneic and hypoxic.  After receiving Narcan , pt began vomiting, ?aspiration.  Intubated for airway protection.  Extubated from vent 6/26.  PMHx:  Unremarkable.   Clinical Impression   Pt admitted for concerns listed above. PTA pt reported that he was independent with all ADL's and IADL's. At this time, he presents with weakness, balance deficits, mild slow processing, and decreased activity tolerance. Overall, pt requiring min guard to min A for all ADL's and functional mobility. Recommending HHOT to maximize independence and safety at home. OT will follow acutely.      Recommendations for follow up therapy are one component of a multi-disciplinary discharge planning process, led by the attending physician.  Recommendations may be updated based on patient status, additional functional criteria and insurance authorization.   Follow Up Recommendations  Home health OT    Assistance Recommended at Discharge Intermittent Supervision/Assistance  Patient can return home with the following A little help with walking and/or transfers;A little help with bathing/dressing/bathroom;Assistance with cooking/housework;Help with stairs or ramp for entrance    Functional Status Assessment  Patient has had a recent decline in their functional status and demonstrates the ability to make significant improvements in function in a reasonable and predictable amount of time.  Equipment Recommendations  None recommended by OT    Recommendations for Other Services       Precautions / Restrictions Precautions Precautions: Fall Precaution Comments: dizziness/lightheadedness Restrictions Weight Bearing Restrictions: No      Mobility Bed Mobility Overal bed mobility:  Modified Independent             General bed mobility comments: extra time only    Transfers Overall transfer level: Needs assistance   Transfers: Sit to/from Stand Sit to Stand: Min guard           General transfer comment: for safety      Balance Overall balance assessment: Mild deficits observed, not formally tested                                         ADL either performed or assessed with clinical judgement   ADL Overall ADL's : Needs assistance/impaired Eating/Feeding: Set up;Sitting   Grooming: Set up;Sitting   Upper Body Bathing: Set up;Sitting   Lower Body Bathing: Minimal assistance;Sitting/lateral leans;Sit to/from stand   Upper Body Dressing : Set up;Sitting   Lower Body Dressing: Minimal assistance;Sitting/lateral leans;Sit to/from stand   Toilet Transfer: Min guard;Ambulation   Toileting- Clothing Manipulation and Hygiene: Min guard;Sitting/lateral lean;Sit to/from stand       Functional mobility during ADLs: Min guard General ADL Comments: Poor balance and weakness, requiring increased assist     Vision Baseline Vision/History: 0 No visual deficits Ability to See in Adequate Light: 0 Adequate Patient Visual Report: No change from baseline Vision Assessment?: No apparent visual deficits     Perception     Praxis      Pertinent Vitals/Pain Pain Assessment Pain Assessment: Faces Faces Pain Scale: Hurts a little bit Pain Location: throat Pain Descriptors / Indicators: Restless, Sore Pain Intervention(s): Monitored during session, Repositioned     Hand Dominance Right   Extremity/Trunk Assessment Upper Extremity Assessment Upper Extremity  Assessment: Generalized weakness   Lower Extremity Assessment Lower Extremity Assessment: Defer to PT evaluation   Cervical / Trunk Assessment Cervical / Trunk Assessment: Normal   Communication Communication Communication: No difficulties (soft spoken with irritated  throat)   Cognition Arousal/Alertness: Awake/alert Behavior During Therapy: Flat affect, WFL for tasks assessed/performed Overall Cognitive Status: Within Functional Limits for tasks assessed (pt relays delayed response time)                                       General Comments  VSS on RA    Exercises     Shoulder Instructions      Home Living Family/patient expects to be discharged to:: Private residence Living Arrangements: Spouse/significant other Available Help at Discharge: Family;Available 24 hours/day Type of Home: House Home Access: Stairs to enter     Home Layout: Two level;Bed/bath upstairs Alternate Level Stairs-Number of Steps: flight Alternate Level Stairs-Rails: Right;Left Bathroom Shower/Tub: Tub/shower unit;Walk-in shower   Bathroom Toilet: Standard     Home Equipment: None          Prior Functioning/Environment Prior Level of Function : Independent/Modified Independent;Driving                        OT Problem List: Decreased strength;Decreased activity tolerance;Impaired balance (sitting and/or standing);Decreased safety awareness;Impaired UE functional use      OT Treatment/Interventions: Self-care/ADL training;Therapeutic exercise;Energy conservation;DME and/or AE instruction;Therapeutic activities;Patient/family education;Balance training    OT Goals(Current goals can be found in the care plan section) Acute Rehab OT Goals Patient Stated Goal: To go home OT Goal Formulation: With patient Time For Goal Achievement: 07/26/21 Potential to Achieve Goals: Good ADL Goals Pt Will Perform Grooming: with modified independence;standing Pt Will Perform Lower Body Bathing: with modified independence;sitting/lateral leans;sit to/from stand Pt Will Perform Lower Body Dressing: with modified independence;sitting/lateral leans;sit to/from stand Pt Will Transfer to Toilet: with modified independence;ambulating Pt Will Perform  Toileting - Clothing Manipulation and hygiene: with modified independence;sitting/lateral leans;sit to/from stand  OT Frequency: Min 2X/week    Co-evaluation              AM-PAC OT "6 Clicks" Daily Activity     Outcome Measure Help from another person eating meals?: A Little Help from another person taking care of personal grooming?: A Little Help from another person toileting, which includes using toliet, bedpan, or urinal?: A Little Help from another person bathing (including washing, rinsing, drying)?: A Little Help from another person to put on and taking off regular upper body clothing?: A Little Help from another person to put on and taking off regular lower body clothing?: A Little 6 Click Score: 18   End of Session Equipment Utilized During Treatment: Gait belt;Rolling walker (2 wheels);Oxygen Nurse Communication: Mobility status  Activity Tolerance: Patient tolerated treatment well Patient left: in bed;with call bell/phone within reach;with family/visitor present  OT Visit Diagnosis: Unsteadiness on feet (R26.81);Other abnormalities of gait and mobility (R26.89);Muscle weakness (generalized) (M62.81)                Time: 1315-1330 OT Time Calculation (min): 15 min Charges:  OT General Charges $OT Visit: 1 Visit OT Evaluation $OT Eval Moderate Complexity: 1 Mod  Niara Bunker H., OTR/L Acute Rehabilitation  Clary Meeker Elane Yolanda Bonine 07/12/2021, 6:22 PM

## 2021-07-12 NOTE — Progress Notes (Signed)
   NAMEStefen Reynolds, MRN:  [id number], DOB:  April 25, 1965, LOS: 3 ADMISSION DATE:  07/09/2021, CONSULTATION DATE:  07/09/21 REFERRING MD:  Raymond Reynolds CHIEF COMPLAINT:  AMS   History of Present Illness:  82M who was BIB EMS after being found unresponsive and essentially apneic, and hypoxic. He received '4mg'$  narcan with EMS and began vomiting and remained minimally responsive. EMS attempted to place a [facility] for airway protection but was not successful. He arrived to ED on NRB with emesis in oropharynx and nasopharynx. He was intubated by ED      PCCM is consulted for admission in this setting   Pertinent  Medical History    Significant Hospital Events: Including procedures, antibiotic start and stop dates in addition to other pertinent events   6/25 john doe suspected OD. Intubated   Interim History / Subjective:  Doing better starting to follow commands. Wife at bedside.  Objective   Blood pressure (!) 142/86, pulse 68, temperature 99 F (37.2 C), temperature source Oral, resp. rate 17, height '6\' 1"'$  (1.854 m), weight 95.7 kg, SpO2 98 %.    FiO2 (%):  [40 %] 40 %   Intake/Output Summary (Last 24 hours) at 07/12/2021 6962 Last data filed at 07/12/2021 0800 Gross per 24 hour  Intake 2232.98 ml  Output 1175 ml  Net 1057.98 ml    Filed Weights   07/10/21 0500 07/11/21 0500 07/12/21 0500  Weight: 92.9 kg 96.2 kg 95.7 kg   Examination: No distress Globally weak Lungs clear +SEM, ext warm No edema Aox3, moves all 4 ext  Resolved Hospital Problem list     Assessment & Plan:  Acute hypoxemic respiratory failure- with likely aspiration pneumonitis, small O2 need persisting Acute metabolic encephalopathy due to polysubstance abuse- improving with supportive care Acute liver injury- question related to drug abuse, improving AKI- improved to near baseline N/V/Constipation- bowel regimen strengthened Hx GERD, HTN, allergies, DJD- home meds reordered Muscular deconditioning  - PT/O2  consult, encourage mobility - Meds reconciled - Augmentin x 2 more days should work - [facility] consult for substance abuse resources, counseling given - Encourage IS, wean O2 for sats > 90% - Okay for floor, appreciate [facility] taking over starting 07/13/21  Raymond Emery MD PCCM

## 2021-07-12 NOTE — Progress Notes (Signed)
Pt states she doesn't wear CPAP at home.  Pt doesn't want to wear CPAP tonight.

## 2021-07-12 NOTE — Evaluation (Signed)
Physical Therapy Evaluation Patient Details Name: Raymond Reynolds MRN: [id number] DOB: [birth year] Today's Date: 07/12/2021  History of Present Illness  pt is a 56 yo male brought by EMS to ED 6/25 after being found unresponsive and essentially apneic and hypoxic.  After receiving Narcan , pt began vomiting, ?aspiration.  Intubated for airway protection.  Extubated from vent 6/26.  PMHx:  Unremarkable.  Clinical Impression  Pt admitted with/for unresponsiveness/apnea and hypoxia.  Pt is still a little "foggy" and needing minimal stability assist when up OOB..  Mobility limited to standing and moving LE's in place.  Pt currently limited functionally due to the problems listed below.  (see problems list.)  Pt will benefit from PT to maximize function and safety to be able to get home safely with available assist .        Recommendations for follow up therapy are one component of a multi-disciplinary discharge planning process, led by the attending physician.  Recommendations may be updated based on patient status, additional functional criteria and insurance authorization.  Follow Up Recommendations No PT follow up (HHPT if doesn't recover as expected)      Assistance Recommended at Discharge PRN  Patient can return home with the following  A little help with walking and/or transfers;A little help with bathing/dressing/bathroom;Assistance with cooking/housework;Assist for transportation;Help with stairs or ramp for entrance    Equipment Recommendations None recommended by PT (TBA prior to DC)  Recommendations for Other Services       Functional Status Assessment Patient has had a recent decline in their functional status and demonstrates the ability to make significant improvements in function in a reasonable and predictable amount of time.     Precautions / Restrictions Precautions Precautions: Fall Precaution Comments: dizziness/lightheadedness      Mobility  Bed Mobility Overal bed  mobility: Modified Independent             General bed mobility comments: extra time only    Transfers Overall transfer level: Needs assistance   Transfers: Sit to/from Stand Sit to Stand: Min assist           General transfer comment: stability assist    Ambulation/Gait             Pre-gait activities: marched in place, sidestepped in confined space holding to bed rail and min guard.    Stairs            Wheelchair Mobility    Modified Rankin (Stroke Patients Only)       Balance Overall balance assessment: Mild deficits observed, not formally tested                                           Pertinent Vitals/Pain Pain Assessment Pain Assessment: Faces Faces Pain Scale: Hurts a little bit Pain Location: throat Pain Descriptors / Indicators: Restless, Sore Pain Intervention(s): Monitored during session    Home Living Family/patient expects to be discharged to:: Private residence Living Arrangements: Spouse/significant other Available Help at Discharge: Family;Available 24 hours/day Type of Home: House Home Access: Stairs to enter     Alternate Level Stairs-Number of Steps: flight Home Layout: Two level;Bed/bath upstairs Home Equipment: None      Prior Function Prior Level of Function : Independent/Modified Independent;Driving                     Hand Dominance  Extremity/Trunk Assessment   Upper Extremity Assessment Upper Extremity Assessment: Defer to OT evaluation (generally weak)    Lower Extremity Assessment Lower Extremity Assessment: Generalized weakness;Overall [facility] for tasks assessed    Cervical / Trunk Assessment Cervical / Trunk Assessment: Normal  Communication   Communication: No difficulties (soft spoken with irritated throat)  Cognition Arousal/Alertness: Awake/alert Behavior During Therapy: Flat affect, WFL for tasks assessed/performed Overall Cognitive Status: Within Functional  Limits for tasks assessed (pt relays "foggy" thinking.)                                          General Comments      Exercises     Assessment/Plan    PT Assessment Patient needs continued PT services  PT Problem List Decreased strength;Decreased activity tolerance;Decreased balance;Decreased mobility;Decreased coordination;Cardiopulmonary status limiting activity       PT Treatment Interventions Gait training;Stair training;Functional mobility training;Therapeutic activities;Balance training;Patient/family education    PT Goals (Current goals can be found in the Care Plan section)  Acute Rehab PT Goals Patient Stated Goal: feeling/moving better. PT Goal Formulation: With patient Time For Goal Achievement: 07/26/21 Potential to Achieve Goals: Good    Frequency Min 3X/week     Co-evaluation               AM-PAC PT "6 Clicks" Mobility  Outcome Measure Help needed turning from your back to your side while in a flat bed without using bedrails?: A Little Help needed moving from lying on your back to sitting on the side of a flat bed without using bedrails?: A Little Help needed moving to and from a bed to a chair (including a wheelchair)?: A Little Help needed standing up from a chair using your arms (e.g., wheelchair or bedside chair)?: A Little Help needed to walk in hospital room?: A Little Help needed climbing 3-5 steps with a railing? : A Lot 6 Click Score: 17    End of Session Equipment Utilized During Treatment: Oxygen Activity Tolerance: Patient tolerated treatment well;Patient limited by fatigue Patient left: in bed;with call bell/phone within reach;with bed alarm set;with family/visitor present Nurse Communication: Mobility status PT Visit Diagnosis: Unsteadiness on feet (R26.81);Other abnormalities of gait and mobility (R26.89);Muscle weakness (generalized) (M62.81)    Time: 1208-1228 PT Time Calculation (min) (ACUTE ONLY): 20  min   Charges:   PT Evaluation $PT Eval Moderate Complexity: 1 Mod          07/12/2021  Ginger Carne., PT Acute Rehabilitation Services [phone]  (pager) [phone]  (office)  Tessie Fass Kamayah Pillay 07/12/2021, 2:23 PM

## 2021-07-12 NOTE — Progress Notes (Signed)
[place] notification ID # , [id number]. Whitman Hero RN,BSN,CM

## 2021-07-13 DIAGNOSIS — J9601 Acute respiratory failure with hypoxia: Secondary | ICD-10-CM

## 2021-07-13 DIAGNOSIS — J690 Pneumonitis due to inhalation of food and vomit: Secondary | ICD-10-CM

## 2021-07-13 LAB — BASIC METABOLIC PANEL
Anion gap: 12 (ref 5–15)
BUN: 10 mg/dL (ref 6–20)
CO2: 26 mmol/L (ref 22–32)
Calcium: 8.7 mg/dL — ABNORMAL LOW (ref 8.9–10.3)
Chloride: 100 mmol/L (ref 98–111)
Creatinine, Ser: 1.12 mg/dL (ref 0.61–1.24)
GFR, Estimated: >60 mL/min (ref >60)
Glucose, Bld: 84 mg/dL (ref 70–99)
Potassium: 4 mmol/L (ref 3.5–5.1)
Sodium: 138 mmol/L (ref 135–145)

## 2021-07-13 LAB — CBC WITH DIFFERENTIAL/PLATELET
Abs Immature Granulocytes: 0.06 10*3/uL (ref 0.00–0.07)
Basophils Absolute: 0 10*3/uL (ref 0.0–0.1)
Basophils Relative: 0 %
Eosinophils Absolute: 0.2 10*3/uL (ref 0.0–0.5)
Eosinophils Relative: 1 %
HCT: 43 % (ref 39.0–52.0)
Hemoglobin: 14.5 g/dL (ref 13.0–17.0)
Immature Granulocytes: 0 %
Lymphocytes Relative: 8 %
Lymphs Abs: 1.2 10*3/uL (ref 0.7–4.0)
MCH: 29.1 pg (ref 26.0–34.0)
MCHC: 33.7 g/dL (ref 30.0–36.0)
MCV: 86.3 fL (ref 80.0–100.0)
Monocytes Absolute: 1.4 10*3/uL — ABNORMAL HIGH (ref 0.1–1.0)
Monocytes Relative: 10 %
Neutro Abs: 11.5 10*3/uL — ABNORMAL HIGH (ref 1.7–7.7)
Neutrophils Relative %: 81 %
Platelets: 253 10*3/uL (ref 150–400)
RBC: 4.98 MIL/uL (ref 4.22–5.81)
RDW: 13.5 % (ref 11.5–15.5)
WBC: 14.3 10*3/uL — ABNORMAL HIGH (ref 4.0–10.5)
nRBC: 0 % (ref 0.0–0.2)

## 2021-07-13 MED ORDER — ENSURE ENLIVE PO LIQD
237.0000 mL | Freq: Two times a day (BID) | ORAL | Status: DC
  Administered 2021-07-13 – 2021-07-14 (×2): 237 mL via ORAL

## 2021-07-13 MED ORDER — ADULT MULTIVITAMIN W/MINERALS CH
1.0000 | ORAL_TABLET | Freq: Every day | ORAL | Status: DC
  Administered 2021-07-13 – 2021-07-14 (×2): 1 via ORAL
  Filled 2021-07-13 (×2): qty 1

## 2021-07-13 NOTE — Plan of Care (Signed)
  Problem: Education: Goal: Ability to describe self-care measures that may prevent or decrease complications (Diabetes Survival Skills Education) will improve Outcome: Progressing   Problem: Coping: Goal: Ability to adjust to condition or change in health will improve Outcome: Progressing   Problem: Nutritional: Goal: Maintenance of adequate nutrition will improve Outcome: Progressing   Problem: Skin Integrity: Goal: Risk for impaired skin integrity will decrease Outcome: Progressing   Problem: Education: Goal: Knowledge of General Education information will improve Description: Including pain rating scale, medication(s)/side effects and non-pharmacologic comfort measures Outcome: Progressing

## 2021-07-13 NOTE — Progress Notes (Signed)
PROGRESS NOTE        PATIENT DETAILS Name: Raymond Reynolds Age: 56 y.o. Sex: male Date of Birth: 10/28/1965 Admit Date: 07/09/2021 Admitting Physician Chi Rodman Pickle, MD [id number], [place]  Brief Summary: Patient is a 56 y.o.  male who presented to the ED after he was found by a friend apneic/unresponsive-he was thought to have overdose-given 4 mg of Narcan by EMS-patient subsequently started vomiting-he was very confused-he was intubated in the emergency room and admitted to the ICU.  He was stabilized in the ICU-extubated on 6/27 and subsequently transferred to the [facility] service on 6/29.   Significant events: 6/25>> found unresponsive/apneic-intubated in the ED-admit to ICU. 6/27>> extubated 6/29>> transfer to [facility].  Significant studies: 6/25>> UDS: Positive for benzodiazepine/cocaine 6/25>> CXR: Bilateral hazy opacities 6/25>> CT head: No acute intracranial abnormality 6/25>> CT C-spine: No acute fracture/subluxation/dislocation.  Right upper lobe consolidation.  Significant microbiology data: 6/25>> blood culture: No growth  Procedures: 6/25-6/27>> ETT  Consults: PCCM  Subjective: Lying comfortably in bed-denies any chest pain or shortness of breath.  Spouse at bedside (she is an Therapist, sports) says he is still somewhat confused.  Objective: Vitals: Blood pressure (!) 161/85, pulse 78, temperature (!) 97.5 F (36.4 C), temperature source Oral, resp. rate 20, height '6\' 1"'$  (1.854 m), weight 95.7 kg, SpO2 95 %.   Exam: Gen Exam:Alert awake-not in any distress HEENT:atraumatic, normocephalic Chest: B/L clear to auscultation anteriorly CVS:S1S2 regular Abdomen:soft non tender, non distended Extremities:no edema Neurology: Non focal Skin: no rash  Pertinent Labs/Radiology:    Latest Ref Rng & Units 07/13/2021    1:33 AM 07/12/2021    1:21 AM 07/11/2021    1:38 AM  CBC  WBC 4.0 - 10.5 K/uL 14.3  13.6  11.1   Hemoglobin 13.0 - 17.0 g/dL 14.5   14.2  14.0   Hematocrit 39.0 - 52.0 % 43.0  42.1  43.3   Platelets 150 - 400 K/uL 253  263  247     Lab Results  Component Value Date   NA 138 07/13/2021   K 4.0 07/13/2021   CL 100 07/13/2021   CO2 26 07/13/2021      Assessment/Plan: Acute hypoxic respiratory failure: Due to narcotic overdose/aspiration pneumonitis-extubated 6/27-currently on 2 L of oxygen.  Continue Augmentin.  Titrate off oxygen as tolerated.  Acute toxic metabolic encephalopathy: Due to polysubstance abuse/drug overdose-encephalopathy improved-still intermittently confused but following commands.  AKI: Hemodynamically mediated-resolved.  HTN: BP stable-continue amlodipine.  GERD: Continue Pepcid  Polysubstance abuse: Concern for fentanyl use-UDS positive for cocaine/benzos.  We will continue to counsel.  Nutrition Status: Nutrition Problem: Inadequate oral intake Etiology: decreased appetite Signs/Symptoms: meal completion < 50%, per patient/family report Interventions: Ensure Enlive (each supplement provides 350kcal and 20 grams of protein), MVI  BMI: Estimated body mass index is 27.84 kg/m as calculated from the following:   Height as of this encounter: '6\' 1"'$  (1.854 m).   Weight as of this encounter: 95.7 kg.   Code status:   Code Status: Full Code   DVT Prophylaxis: heparin injection 5,000 Units Start: 07/09/21 2200   Family Communication: None at bedside   Disposition Plan: Status is: Inpatient Remains inpatient appropriate because: Resolving hypoxia/pneumonia/encephalopathy-not yet at baseline-suspect requires another 1-2 days before consideration of discharge.   Planned Discharge Destination:Home   Diet: Diet Order  Diet regular Room service appropriate? Yes; Fluid consistency: Thin  Diet effective now                     Antimicrobial agents: Anti-infectives (From admission, onward)    Start     Dose/Rate Route Frequency Ordered Stop   07/12/21 1000   amoxicillin-clavulanate (AUGMENTIN) 875-125 MG per tablet 1 tablet        1 tablet Oral Every 12 hours 07/12/21 0847 07/15/21 0959   07/10/21 1030  linezolid (ZYVOX) IVPB 600 mg  Status:  Discontinued        600 mg 300 mL/hr over 60 Minutes Intravenous Every 12 hours 07/10/21 0930 07/12/21 0847   07/09/21 1730  Ampicillin-Sulbactam (UNASYN) 3 g in sodium chloride 0.9 % 100 mL IVPB  Status:  Discontinued        3 g 200 mL/hr over 30 Minutes Intravenous Every 6 hours 07/09/21 1728 07/12/21 0847        MEDICATIONS: Scheduled Meds:  amLODipine  10 mg Oral Daily   amoxicillin-clavulanate  1 tablet Oral Q12H   carvedilol  6.25 mg Oral BID WC   Chlorhexidine Gluconate Cloth  6 each Topical Q0600   diclofenac Sodium  2 g Topical TID   docusate sodium  100 mg Oral BID   famotidine  40 mg Oral QHS   feeding supplement  237 mL Oral BID BM   fluticasone  1 spray Each Nare BID   heparin  5,000 Units Subcutaneous Q8H   levobunolol  1 drop Both Eyes q AM   multivitamin with minerals  1 tablet Oral Daily   mupirocin ointment  1 Application Nasal BID   polyethylene glycol  17 g Oral Daily   senna  1 tablet Oral Daily   traZODone  50 mg Oral QHS   Continuous Infusions: PRN Meds:.acetaminophen, ipratropium, methocarbamol, ondansetron (ZOFRAN) IV, mouth rinse, polyethylene glycol   I have personally reviewed following labs and imaging studies  LABORATORY DATA: CBC: Recent Labs  Lab 07/09/21 1626 07/09/21 1630 07/09/21 1703 07/10/21 0620 07/11/21 0138 07/12/21 0121 07/13/21 0133  WBC 12.1*  --   --  13.5* 11.1* 13.6* 14.3*  NEUTROABS 7.4  --   --   --  7.9* 11.8* 11.5*  HGB 16.0   < > 14.3 13.8 14.0 14.2 14.5  HCT 48.5   < > 42.0 40.4 43.3 42.1 43.0  MCV 88.0  --   --  85.2 86.3 87.2 86.3  PLT 396  --   --  281 247 263 253   < > = values in this interval not displayed.    Basic Metabolic Panel: Recent Labs  Lab 07/09/21 1626 07/09/21 1630 07/09/21 1703 07/10/21 0620  07/11/21 0138 07/11/21 1409 07/12/21 0121 07/13/21 0133  NA 137 138 138 144 141  --  140 138  K 3.4* 3.3* 3.1* 3.3* 2.8* 4.1 4.2 4.0  CL 96* 97*  --  103 108  --  103 100  CO2 27  --   --  25 23  --  26 26  GLUCOSE 324* 325*  --  91 83  --  112* 84  BUN 10 11  --  15 15  --  8 10  CREATININE 1.83* 1.70*  --  1.94* 1.93*  --  1.17 1.12  CALCIUM 8.6*  --   --  9.0 8.6*  --  8.5* 8.7*  MG  --   --   --  1.8  --   --  2.4  --   PHOS  --   --   --   --   --   --  4.5  --     GFR: Estimated Creatinine Clearance: 85.2 mL/min (by C-G formula based on SCr of 1.12 mg/dL).  Liver Function Tests: Recent Labs  Lab 07/09/21 1626 07/10/21 0620 07/11/21 0138  AST 86* 48* 33  ALT 77* 65* 45*  ALKPHOS 67 50 50  BILITOT 1.2 1.7* 1.1  PROT 6.7 5.4* 5.4*  ALBUMIN 3.7 3.0* 2.9*   Recent Labs  Lab 07/11/21 0826  LIPASE 25   Recent Labs  Lab 07/11/21 0827  AMMONIA 31    Coagulation Profile: No results for input(s): "INR", "PROTIME" in the last 168 hours.  Cardiac Enzymes: Recent Labs  Lab 07/09/21 1626  CKTOTAL 182    BNP (last 3 results) No results for input(s): "PROBNP" in the last 8760 hours.  Lipid Profile: No results for input(s): "CHOL", "HDL", "LDLCALC", "TRIG", "CHOLHDL", "LDLDIRECT" in the last 72 hours.  Thyroid Function Tests: No results for input(s): "TSH", "T4TOTAL", "FREET4", "T3FREE", "THYROIDAB" in the last 72 hours.  Anemia Panel: No results for input(s): "VITAMINB12", "FOLATE", "FERRITIN", "TIBC", "IRON", "RETICCTPCT" in the last 72 hours.  Urine analysis:    Component Value Date/Time   COLORURINE YELLOW 07/09/2021 1623   APPEARANCEUR HAZY (A) 07/09/2021 1623   LABSPEC 1.017 07/09/2021 1623   PHURINE 6.0 07/09/2021 1623   GLUCOSEU >=500 (A) 07/09/2021 1623   HGBUR SMALL (A) 07/09/2021 1623   BILIRUBINUR NEGATIVE 07/09/2021 1623   KETONESUR NEGATIVE 07/09/2021 1623   PROTEINUR 100 (A) 07/09/2021 1623   NITRITE NEGATIVE 07/09/2021 1623    LEUKOCYTESUR NEGATIVE 07/09/2021 1623    Sepsis Labs: Lactic Acid, Venous    Component Value Date/Time   LATICACIDVEN 3.2 (HH) 07/10/2021 1207    MICROBIOLOGY: Recent Results (from the past 240 hour(s))  MRSA Next Gen by PCR, Nasal     Status: Abnormal   Collection Time: 07/09/21  6:38 PM   Specimen: Nasal Mucosa; Nasal Swab  Result Value Ref Range Status   MRSA by PCR Next Gen DETECTED (A) NOT DETECTED Final    Comment: RESULT CALLED TO, READ BACK BY AND VERIFIED WITH: C KING,RN'@0152'$  07/10/21 [place] (NOTE) The GeneXpert MRSA Assay (FDA approved for NASAL specimens only), is one component of a comprehensive MRSA colonization surveillance program. It is not intended to diagnose MRSA infection nor to guide or monitor treatment for MRSA infections. Test performance is not FDA approved in patients less than 55 years old. Performed at [facility], [place] [address] [place], [place] [address]   Blood culture (routine x 2)     Status: None (Preliminary result)   Collection Time: 07/09/21  6:42 PM   Specimen: BLOOD  Result Value Ref Range Status   Specimen Description BLOOD LEFT ANTECUBITAL  Final   Special Requests   Final    BOTTLES DRAWN AEROBIC AND ANAEROBIC Blood Culture results may not be optimal due to an inadequate volume of blood received in culture bottles   Culture   Final    NO GROWTH 4 DAYS Performed at [facility], [place] [address] [place],  [address]    Report Status PENDING  Incomplete  Blood culture (routine x 2)     Status: None (Preliminary result)   Collection Time: 07/09/21  6:51 PM   Specimen: BLOOD RIGHT HAND  Result Value Ref Range Status   Specimen Description BLOOD RIGHT HAND  Final   Special Requests   Final    BOTTLES DRAWN AEROBIC AND ANAEROBIC Blood Culture results may not be optimal due to an inadequate volume of blood received in culture bottles   Culture   Final    NO GROWTH 4 DAYS Performed at [facility], [place]  [address], [place], [place] [address]    Report Status PENDING  Incomplete    RADIOLOGY STUDIES/RESULTS: No results found.   LOS: 4 days   Oren Binet, MD  [facility]    To contact the attending provider between 7A-7P or the covering provider during after hours 7P-7A, please log into the web site [email or web address] and access using universal [place] password for that web site. If you do not have the password, please call the hospital operator.  07/13/2021, 1:02 PM

## 2021-07-13 NOTE — Progress Notes (Signed)
Physical Therapy Treatment Patient Details Name: Raymond Reynolds MRN: [id number] DOB: September 15, 1965 Today's Date: 07/13/2021   History of Present Illness pt is a 56 yo male brought by EMS to ED 6/25 after being found unresponsive and essentially apneic and hypoxic.  After receiving Narcan , pt began vomiting, ?aspiration.  Intubated for airway protection.  Extubated from vent 6/26.  PMHx:  Unremarkable.    PT Comments    Pt was seen for mobility today after warming up with exercises.  Has a great deal of control of standing balance with preliminary assist to walk.  Pt can finally manage better balance but does desat with 2L O2.  Increased to 3L and maintained sats at 94% on that trial.  Follow along for PT goals and maintain readings on sats, on HR and monitor balance control as pt may need SPC afterward.  No PT is expected for dc but will reconsider of O2 is still needed at the time of DC.  Recommendations for follow up therapy are one component of a multi-disciplinary discharge planning process, led by the attending physician.  Recommendations may be updated based on patient status, additional functional criteria and insurance authorization.  Follow Up Recommendations  No PT follow up     Assistance Recommended at Discharge PRN  Patient can return home with the following A little help with walking and/or transfers;A little help with bathing/dressing/bathroom;Assistance with cooking/housework;Assist for transportation;Help with stairs or ramp for entrance   Equipment Recommendations  None recommended by PT (consider a cane)    Recommendations for Other Services       Precautions / Restrictions Precautions Precautions: Fall Precaution Comments: dizziness/lightheadedness; requires O2 to walk Restrictions Weight Bearing Restrictions: No     Mobility  Bed Mobility Overal bed mobility: Needs Assistance Bed Mobility: Supine to Sit, Sit to Supine     Supine to sit: Min guard Sit to  supine: Min guard   General bed mobility comments: min guard for lines and covering    Transfers Overall transfer level: Needs assistance Equipment used: 1 person hand held assist Transfers: Sit to/from Stand Sit to Stand: Min guard                Ambulation/Gait Ambulation/Gait assistance: Min guard Gait Distance (Feet): 50 Feet (25 x 2) Assistive device: 1 person hand held assist Gait Pattern/deviations: Step-through pattern, Decreased stride length, Wide base of support Gait velocity: reduced with drop in O2 sats Gait velocity interpretation: <1.31 ft/sec, indicative of household ambulator   General Gait Details: mild adjustments to gait but no signs of a fall risk   Stairs             Wheelchair Mobility    Modified Rankin (Stroke Patients Only)       Balance Overall balance assessment: Needs assistance Sitting-balance support: Feet supported Sitting balance-Leahy Scale: Good     Standing balance support: During functional activity, No upper extremity supported Standing balance-Leahy Scale: Fair Standing balance comment: pt initially needed HHA for walk steadying but was min guard without direct help on second walk                            Cognition Arousal/Alertness: Awake/alert Behavior During Therapy: Flat affect, WFL for tasks assessed/performed Overall Cognitive Status: Within Functional Limits for tasks assessed  General Comments: did not ask pt in depth questions to ck his cognition        Exercises General Exercises - Lower Extremity Ankle Circles/Pumps: AROM, 5 reps Quad Sets: AROM, 15 reps Gluteal Sets: AROM, 15 reps Heel Slides: AROM, 15 reps Hip ABduction/ADduction: AAROM, 15 reps Straight Leg Raises: AROM, 15 reps    General Comments General comments (skin integrity, edema, etc.): pt was able to walk in the room with O2 sats initially dropping down with walk, but added  to 3L O2 for the walk with 94% being lowest value      Pertinent Vitals/Pain Pain Assessment Pain Assessment: No/denies pain    Home Living                          Prior Function            PT Goals (current goals can now be found in the care plan section) Acute Rehab PT Goals Patient Stated Goal: get home Progress towards PT goals: Progressing toward goals    Frequency    Min 3X/week      PT Plan Current plan remains appropriate    Co-evaluation              AM-PAC PT "6 Clicks" Mobility   Outcome Measure  Help needed turning from your back to your side while in a flat bed without using bedrails?: None Help needed moving from lying on your back to sitting on the side of a flat bed without using bedrails?: A Little Help needed moving to and from a bed to a chair (including a wheelchair)?: A Little Help needed standing up from a chair using your arms (e.g., wheelchair or bedside chair)?: A Little Help needed to walk in hospital room?: A Little Help needed climbing 3-5 steps with a railing? : A Little 6 Click Score: 19    End of Session Equipment Utilized During Treatment: Oxygen Activity Tolerance: Patient tolerated treatment well;Patient limited by fatigue Patient left: in bed;with call bell/phone within reach;with bed alarm set;with family/visitor present Nurse Communication: Mobility status PT Visit Diagnosis: Unsteadiness on feet (R26.81);Other abnormalities of gait and mobility (R26.89);Muscle weakness (generalized) (M62.81)     Time: 4854-6270 PT Time Calculation (min) (ACUTE ONLY): 33 min  Charges:  $Gait Training: 8-22 mins $Therapeutic Exercise: 8-22 mins      Ramond Dial 07/13/2021, 4:42 PM  Mee Hives, PT PhD Acute Rehab Dept. Number: [place] [facility] [place]

## 2021-07-13 NOTE — Progress Notes (Signed)
Nutrition Follow-up  DOCUMENTATION CODES:   Not applicable  INTERVENTION:   Ensure Enlive po BID, each supplement provides 350 kcal and 20 grams of protein.  MVI with minerals daily.  Change diet to room service yes with assist.  NUTRITION DIAGNOSIS:   Inadequate oral intake related to decreased appetite as evidenced by meal completion < 50%, per patient/family report.  Ongoing   GOAL:   Patient will meet greater than or equal to 90% of their needs  Progressing   MONITOR:   PO intake, Supplement acceptance  REASON FOR ASSESSMENT:   Ventilator, Consult Enteral/tube feeding initiation and management  ASSESSMENT:   56 yo male admitted after being found down, suspected overdose, AKI. Intubated in the ED. No past medical history on file.  Patient was extubated 6/27. Diet was advanced to regular. Meal intakes documented at 25% (breakfast today). Wife reports that they are not breakfast eaters. RD assisted wife with calling the [facility] to order patient's lunch meal. Will change diet to room service yes with assistance. Will also add Ensure supplements BID between meals to increase protein and calorie intake.    Labs reviewed.  Medications reviewed and include Colace, Pepcid, Miralax, Senokot.  Diet Order:   Diet Order             Diet regular Room service appropriate? Yes; Fluid consistency: Thin  Diet effective now                   EDUCATION NEEDS:   No education needs have been identified at this time  Skin:  Skin Assessment: Reviewed RN Assessment  Last BM:  6/29  Height:   Ht Readings from Last 1 Encounters:  07/09/21 '6\' 1"'$  (1.854 m)    Weight:   Wt Readings from Last 1 Encounters:  07/12/21 95.7 kg    Ideal Body Weight:  83.6 kg  BMI:  Body mass index is 27.84 kg/m.  Estimated Nutritional Needs:   Kcal:  2200-2400  Protein:  120-140 gm  Fluid:  2.2-2.4 L   Lucas Mallow RD, LDN, CNSC Please refer to Amion for contact information.

## 2021-07-14 ENCOUNTER — Other Ambulatory Visit (HOSPITAL_COMMUNITY): Payer: Self-pay

## 2021-07-14 DIAGNOSIS — T50901D Poisoning by unspecified drugs, medicaments and biological substances, accidental (unintentional), subsequent encounter: Secondary | ICD-10-CM

## 2021-07-14 DIAGNOSIS — J9601 Acute respiratory failure with hypoxia: Secondary | ICD-10-CM

## 2021-07-14 DIAGNOSIS — J690 Pneumonitis due to inhalation of food and vomit: Secondary | ICD-10-CM

## 2021-07-14 DIAGNOSIS — T50904A Poisoning by unspecified drugs, medicaments and biological substances, undetermined, initial encounter: Secondary | ICD-10-CM

## 2021-07-14 LAB — CULTURE, BLOOD (ROUTINE X 2)
Culture: NO GROWTH
Culture: NO GROWTH

## 2021-07-14 MED ORDER — AMOXICILLIN-POT CLAVULANATE 875-125 MG PO TABS
1.0000 | ORAL_TABLET | Freq: Two times a day (BID) | ORAL | 0 refills | Status: AC
Start: 2021-07-14 — End: 2021-07-16
  Filled 2021-07-14: qty 4, 2d supply, fill #0

## 2021-07-14 MED ORDER — CARVEDILOL 6.25 MG PO TABS
6.2500 mg | ORAL_TABLET | Freq: Two times a day (BID) | ORAL | 2 refills | Status: AC
  Filled 2021-07-14: qty 60, 30d supply, fill #0

## 2021-07-14 NOTE — Progress Notes (Signed)
CSW received consult regarding substance use. CSW met with patient and wife at bedside who was on an online meeting. Patient granted verbal permission for his wife to remain in the room and that she is updated on his current issues. CSW offered substance use resources and local therapy resources to patient that he can access through the [place]. He confirmed that he goes to the [place] PCP. No other needs identified at this time.   Gilmore Laroche, MSW, [facility]

## 2021-07-14 NOTE — Progress Notes (Signed)
SATURATION QUALIFICATIONS: (This note is used to comply with regulatory documentation for home oxygen)  Patient Saturations on Room Air at Rest = 98%  Patient Saturations on Room Air while Ambulating = 95%   

## 2021-07-14 NOTE — Discharge Summary (Signed)
PATIENT DETAILS Name: Raymond Reynolds Age: 56 y.o. Sex: male Date of Birth: Oct 11, 1965 MRN: [id number]. Admitting Physician: Margaretha Seeds, MD CWC:[id number], [place]  Admit Date: 07/09/2021 Discharge date: 07/14/2021  Recommendations for Outpatient Follow-up:  Follow up with PCP in 1-2 weeks Please obtain CMP/CBC in one week Repeat chest x-ray in 4 to 6 weeks to document resolution of PNA.   Admitted From:  Home  Disposition: Home   Discharge Condition: good  CODE STATUS:   Code Status: Full Code   Diet recommendation:  Diet Order             Diet - low sodium heart healthy           Diet regular Room service appropriate? Yes with Assist; Fluid consistency: Thin  Diet effective now                    Brief Summary: Patient is a 56 y.o.  male who presented to the ED after he was found by a friend apneic/unresponsive-he was thought to have overdose-given 4 mg of Narcan by EMS-patient subsequently started vomiting-he was very confused-he was intubated in the emergency room and admitted to the ICU.  He was stabilized in the ICU-extubated on 6/27 and subsequently transferred to the [facility] service on 6/29.     Significant events: 6/25>> found unresponsive/apneic-intubated in the ED-admit to ICU. 6/27>> extubated 6/29>> transfer to [facility].   Significant studies: 6/25>> UDS: Positive for benzodiazepine/cocaine 6/25>> CXR: Bilateral hazy opacities 6/25>> CT head: No acute intracranial abnormality 6/25>> CT C-spine: No acute fracture/subluxation/dislocation.  Right upper lobe consolidation.   Significant microbiology data: 6/25>> blood culture: No growth   Procedures: 6/25-6/27>> ETT   Consults: PCCM  Brief Hospital Course: Acute hypoxic respiratory failure: Due to narcotic overdose/aspiration pneumonitis-extubated 6/27-currently on 2 L of oxygen.  Continue Augmentin for a few more days on discharge.  Titrated off oxygen-on room air at rest and when he  ambulates.  PCP to repeat two-view chest x-ray in 4 to 6 weeks to document resolution of PNA.  Aspiration PNA: See above   Acute toxic metabolic encephalopathy: Due to polysubstance abuse/drug overdose-encephalopathy has improved significantly-Per spouse at bedside-although much improved-he is still intermittently confused.  PCP to reassess in the next few weeks and initiate further work-up if he still has confusion and not back to his baseline.  He may have some amount of ICU delirium playing a role as well.    AKI: Hemodynamically mediated-resolved.   HTN: BP stable-continue amlodipine/Coreg.  GERD: Continue Pepcid   Polysubstance abuse: Concern for fentanyl use-UDS positive for cocaine/benzos.  Have counseled extensively-understands life-threatening/life disabling risks of drug use/polypharmacy.     Nutrition Status: Nutrition Problem: Inadequate oral intake Etiology: decreased appetite Signs/Symptoms: meal completion < 50%, per patient/family report Interventions: Ensure Enlive (each supplement provides 350kcal and 20 grams of protein), MVI   BMI: Estimated body mass index is 27.84 kg/m as calculated from the following:   Height as of this encounter: '6\' 1"'$  (1.854 m).   Weight as of this encounter: 95.7 kg.   Discharge Diagnoses:  Principal Problem:   Drug overdose Acute toxic encephalopathy Aspiration PNA Acute hypoxic respiratory failure  Discharge Instructions:  Activity:  As tolerated with Full fall precautions use walker/cane & assistance as needed   Discharge Instructions     Call MD for:  persistant nausea and vomiting   Complete by: As directed    Call MD for:  severe uncontrolled pain  Complete by: As directed    Diet - low sodium heart healthy   Complete by: As directed    Discharge instructions   Complete by: As directed    Follow with Primary MD  Clinic[facility] in 1-2 weeks  Please stop all illicit drug use-it can cause life-threatening and  life disabling risks.  Please get a complete blood count and chemistry panel checked by your Primary MD at your next visit, and again as instructed by your Primary MD.  Get Medicines reviewed and adjusted: Please take all your medications with you for your next visit with your Primary MD  Laboratory/radiological data: Please request your Primary MD to go over all hospital tests and procedure/radiological results at the follow up, please ask your Primary MD to get all Hospital records sent to his/her office.  In some cases, they will be blood work, cultures and biopsy results pending at the time of your discharge. Please request that your primary care M.D. follows up on these results.  Also Note the following: If you experience worsening of your admission symptoms, develop shortness of breath, life threatening emergency, suicidal or homicidal thoughts you must seek medical attention immediately by calling 911 or calling your MD immediately  if symptoms less severe.  You must read complete instructions/literature along with all the possible adverse reactions/side effects for all the Medicines you take and that have been prescribed to you. Take any new Medicines after you have completely understood and accpet all the possible adverse reactions/side effects.   Do not drive when taking Pain medications or sleeping medications (Benzodaizepines)  Do not take more than prescribed Pain, Sleep and Anxiety Medications. It is not advisable to combine anxiety,sleep and pain medications without talking with your primary care practitioner  Special Instructions: If you have smoked or chewed Tobacco  in the last 2 yrs please stop smoking, stop any regular Alcohol  and or any Recreational drug use.  Wear Seat belts while driving.  Please note: You were cared for by a hospitalist during your hospital stay. Once you are discharged, your primary care physician will handle any further medical issues. Please note  that NO REFILLS for any discharge medications will be authorized once you are discharged, as it is imperative that you return to your primary care physician (or establish a relationship with a primary care physician if you do not have one) for your post hospital discharge needs so that they can reassess your need for medications and monitor your lab values.   Increase activity slowly   Complete by: As directed       Allergies as of 07/14/2021   No Known Allergies      Medication List     TAKE these medications    amLODipine 10 MG tablet Commonly known as: NORVASC Take 10 mg by mouth daily.   amoxicillin-clavulanate 875-125 MG tablet Commonly known as: AUGMENTIN Take 1 tablet by mouth every 12 (twelve) hours for 2 days.   carvedilol 6.25 MG tablet Commonly known as: COREG Take 1 tablet (6.25 mg total) by mouth 2 (two) times daily with a meal.   diclofenac Sodium 1 % Gel Commonly known as: VOLTAREN Apply 2 g topically 3 (three) times daily.   famotidine 40 MG tablet Commonly known as: PEPCID Take 40 mg by mouth at bedtime.   fluticasone 50 MCG/ACT nasal spray Commonly known as: FLONASE Place 1 spray into both nostrils 2 (two) times daily.   ipratropium 0.06 % nasal spray Commonly  known as: ATROVENT Place 2 sprays into both nostrils 4 (four) times daily as needed (nasal congestion).   levobunolol 0.5 % ophthalmic solution Commonly known as: BETAGAN Place 1 drop into both eyes in the morning.   methocarbamol 750 MG tablet Commonly known as: ROBAXIN Take 750 mg by mouth at bedtime as needed for muscle spasms.   traZODone 50 MG tablet Commonly known as: DESYREL Take 50 mg by mouth at bedtime.        Follow-up Information     Clinic, [place]. Schedule an appointment as soon as possible for a visit in 1 week(s).   Contact information: [place] [address] [phone]                No Known  Allergies   Other Procedures/Studies: DG Abd 1 View  Result Date: 07/10/2021 CLINICAL DATA:  Abdominal distension EXAM: ABDOMEN - 1 VIEW COMPARISON:  None Available. FINDINGS: Nasogastric tube with the tip projecting over the stomach. No bowel dilatation to suggest obstruction. No evidence of pneumoperitoneum, portal venous gas or pneumatosis. No pathologic calcifications along the expected course of the ureters. No acute osseous abnormality. IMPRESSION: 1. Nasogastric tube with the tip projecting over the stomach. Electronically Signed   By: Kathreen Devoid M.D.   On: 07/10/2021 10:22   CT HEAD WO CONTRAST (5MM)  Result Date: 07/09/2021 CLINICAL DATA:  Patient found unresponsive and apneic today. Possible overdose. EXAM: CT HEAD WITHOUT CONTRAST CT CERVICAL SPINE WITHOUT CONTRAST TECHNIQUE: Multidetector CT imaging of the head and cervical spine was performed following the standard protocol without intravenous contrast. Multiplanar CT image reconstructions of the cervical spine were also generated. RADIATION DOSE REDUCTION: This exam was performed according to the departmental dose-optimization program which includes automated exposure control, adjustment of the mA and/or kV according to patient size and/or use of iterative reconstruction technique. COMPARISON:  None Available. FINDINGS: CT HEAD FINDINGS Brain: No evidence of acute infarction, hemorrhage, hydrocephalus, extra-axial collection or mass lesion/mass effect. Vascular: No hyperdense vessel or unexpected calcification. Skull: Normal. Negative for fracture or focal lesion. Sinuses/Orbits: Opacification of the ethmoid air cells. Mucosal thickening of the right maxillary sinus. Other: None. CT CERVICAL SPINE FINDINGS Alignment: Mild retrolisthesis of C5. Skull base and vertebrae: No acute fracture. No primary bone lesion or focal pathologic process. Soft tissues and spinal canal: No prevertebral fluid or swelling. No visible canal hematoma. Disc  levels: Degenerate disc disease prominent at C5-C6 with associated uncovertebral joint arthropathy. Mild-to-moderate bilateral neural foraminal stenosis at C5-C6. Upper chest: Right upper lobe consolidation with air bronchograms concerning for airspace disease. Other: Endotracheal tube and feeding tube in place. IMPRESSION: 1.  No acute intracranial abnormality. 2.  No acute cervical spine fracture or traumatic subluxation. 3. Degenerate disc disease of the cervical spine prominent at C5-C6 with mild bilateral neural foraminal stenosis. 4. Right upper lobe consolidation with air bronchograms concerning for pneumonia. 5.  NG tube and endotracheal tube in place. 6.  Paranasal sinus disease. Electronically Signed   By: Keane Police D.O.   On: 07/09/2021 17:17   CT Cervical Spine Wo Contrast  Result Date: 07/09/2021 CLINICAL DATA:  Patient found unresponsive and apneic today. Possible overdose. EXAM: CT HEAD WITHOUT CONTRAST CT CERVICAL SPINE WITHOUT CONTRAST TECHNIQUE: Multidetector CT imaging of the head and cervical spine was performed following the standard protocol without intravenous contrast. Multiplanar CT image reconstructions of the cervical spine were also generated. RADIATION DOSE REDUCTION: This exam was performed according to the  departmental dose-optimization program which includes automated exposure control, adjustment of the mA and/or kV according to patient size and/or use of iterative reconstruction technique. COMPARISON:  None Available. FINDINGS: CT HEAD FINDINGS Brain: No evidence of acute infarction, hemorrhage, hydrocephalus, extra-axial collection or mass lesion/mass effect. Vascular: No hyperdense vessel or unexpected calcification. Skull: Normal. Negative for fracture or focal lesion. Sinuses/Orbits: Opacification of the ethmoid air cells. Mucosal thickening of the right maxillary sinus. Other: None. CT CERVICAL SPINE FINDINGS Alignment: Mild retrolisthesis of C5. Skull base and vertebrae:  No acute fracture. No primary bone lesion or focal pathologic process. Soft tissues and spinal canal: No prevertebral fluid or swelling. No visible canal hematoma. Disc levels: Degenerate disc disease prominent at C5-C6 with associated uncovertebral joint arthropathy. Mild-to-moderate bilateral neural foraminal stenosis at C5-C6. Upper chest: Right upper lobe consolidation with air bronchograms concerning for airspace disease. Other: Endotracheal tube and feeding tube in place. IMPRESSION: 1.  No acute intracranial abnormality. 2.  No acute cervical spine fracture or traumatic subluxation. 3. Degenerate disc disease of the cervical spine prominent at C5-C6 with mild bilateral neural foraminal stenosis. 4. Right upper lobe consolidation with air bronchograms concerning for pneumonia. 5.  NG tube and endotracheal tube in place. 6.  Paranasal sinus disease. Electronically Signed   By: Keane Police D.O.   On: 07/09/2021 17:17   DG Chest Port 1 View  Result Date: 07/09/2021 CLINICAL DATA:  Status post intubation.  Apnea. EXAM: PORTABLE CHEST 1 VIEW COMPARISON:  04/15/1979 FINDINGS: ET tube tip is in satisfactory position above the carina. There is a endotracheal tube with tip and side port below the GE junction. Normal cardiomediastinal contours. Mild bilateral hazy lung opacities are identified within the right upper lobe, right lower lobe and left lower lobe. No sign of pleural effusion or pneumothorax. Visualized osseous structures are unremarkable. IMPRESSION: 1. Satisfactory position of ET tube and NG tube. 2. Bilateral hazy lung opacities, which may reflect areas of asymmetric edema and or aspiration. Electronically Signed   By: Kerby Moors M.D.   On: 07/09/2021 17:05     TODAY-DAY OF DISCHARGE:  Subjective:   Raymond Reynolds today has no headache,no chest abdominal pain,no new weakness tingling or numbness, feels much better wants to go home today.  Objective:   Blood pressure (!) 158/92, pulse 74,  temperature 98 F (36.7 C), temperature source Oral, resp. rate 17, height '6\' 1"'$  (1.854 m), weight 95.7 kg, SpO2 98 %.  Intake/Output Summary (Last 24 hours) at 07/14/2021 1036 Last data filed at 07/14/2021 0600 Gross per 24 hour  Intake --  Output 3200 ml  Net -3200 ml   Filed Weights   07/10/21 0500 07/11/21 0500 07/12/21 0500  Weight: 92.9 kg 96.2 kg 95.7 kg    Exam: Awake Alert, Oriented *3, No new F.N deficits, Normal affect Benedict.AT,PERRAL Supple Neck,No JVD, No cervical lymphadenopathy appriciated.  Symmetrical Chest wall movement, Good air movement bilaterally, CTAB RRR,No Gallops,Rubs or new Murmurs, No Parasternal Heave +ve B.Sounds, Abd Soft, Non tender, No organomegaly appriciated, No rebound -guarding or rigidity. No Cyanosis, Clubbing or edema, No new Rash or bruise   PERTINENT RADIOLOGIC STUDIES: No results found.   PERTINENT LAB RESULTS: CBC: Recent Labs    07/12/21 0121 07/13/21 0133  WBC 13.6* 14.3*  HGB 14.2 14.5  HCT 42.1 43.0  PLT 263 253   CMET CMP     Component Value Date/Time   NA 138 07/13/2021 0133   K 4.0 07/13/2021 0133   CL  100 07/13/2021 0133   CO2 26 07/13/2021 0133   GLUCOSE 84 07/13/2021 0133   BUN 10 07/13/2021 0133   CREATININE 1.12 07/13/2021 0133   CALCIUM 8.7 (L) 07/13/2021 0133   PROT 5.4 (L) 07/11/2021 0138   ALBUMIN 2.9 (L) 07/11/2021 0138   AST 33 07/11/2021 0138   ALT 45 (H) 07/11/2021 0138   ALKPHOS 50 07/11/2021 0138   BILITOT 1.1 07/11/2021 0138   GFRNONAA >60 07/13/2021 0133    GFR Estimated Creatinine Clearance: 85.2 mL/min (by C-G formula based on SCr of 1.12 mg/dL). No results for input(s): "LIPASE", "AMYLASE" in the last 72 hours. No results for input(s): "CKTOTAL", "CKMB", "CKMBINDEX", "TROPONINI" in the last 72 hours. Invalid input(s): "POCBNP" No results for input(s): "DDIMER" in the last 72 hours. No results for input(s): "HGBA1C" in the last 72 hours. No results for input(s): "CHOL", "HDL",  "LDLCALC", "TRIG", "CHOLHDL", "LDLDIRECT" in the last 72 hours. No results for input(s): "TSH", "T4TOTAL", "T3FREE", "THYROIDAB" in the last 72 hours.  Invalid input(s): "FREET3" No results for input(s): "VITAMINB12", "FOLATE", "FERRITIN", "TIBC", "IRON", "RETICCTPCT" in the last 72 hours. Coags: No results for input(s): "INR" in the last 72 hours.  Invalid input(s): "PT" Microbiology: Recent Results (from the past 240 hour(s))  MRSA Next Gen by PCR, Nasal     Status: Abnormal   Collection Time: 07/09/21  6:38 PM   Specimen: Nasal Mucosa; Nasal Swab  Result Value Ref Range Status   MRSA by PCR Next Gen DETECTED (A) NOT DETECTED Final    Comment: RESULT CALLED TO, READ BACK BY AND VERIFIED WITH: C KING,RN'@0152'$  07/10/21 [place] (NOTE) The GeneXpert MRSA Assay (FDA approved for NASAL specimens only), is one component of a comprehensive MRSA colonization surveillance program. It is not intended to diagnose MRSA infection nor to guide or monitor treatment for MRSA infections. Test performance is not FDA approved in patients less than 67 years old. Performed at [facility], [place] [address] [place], [place] [address]   Blood culture (routine x 2)     Status: None (Preliminary result)   Collection Time: 07/09/21  6:42 PM   Specimen: BLOOD  Result Value Ref Range Status   Specimen Description BLOOD LEFT ANTECUBITAL  Final   Special Requests   Final    BOTTLES DRAWN AEROBIC AND ANAEROBIC Blood Culture results may not be optimal due to an inadequate volume of blood received in culture bottles   Culture   Final    NO GROWTH 4 DAYS Performed at [facility], [place] [address] [place], [place] [address]    Report Status PENDING  Incomplete  Blood culture (routine x 2)     Status: None (Preliminary result)   Collection Time: 07/09/21  6:51 PM   Specimen: BLOOD RIGHT HAND  Result Value Ref Range Status   Specimen Description BLOOD RIGHT HAND  Final   Special Requests   Final     BOTTLES DRAWN AEROBIC AND ANAEROBIC Blood Culture results may not be optimal due to an inadequate volume of blood received in culture bottles   Culture   Final    NO GROWTH 4 DAYS Performed at [facility], [place] [address], [place], [place] [address]    Report Status PENDING  Incomplete    FURTHER DISCHARGE INSTRUCTIONS:  Get Medicines reviewed and adjusted: Please take all your medications with you for your next visit with your Primary MD  Laboratory/radiological data: Please request your Primary MD to go over  all hospital tests and procedure/radiological results at the follow up, please ask your Primary MD to get all Hospital records sent to his/her office.  In some cases, they will be blood work, cultures and biopsy results pending at the time of your discharge. Please request that your primary care M.D. goes through all the records of your hospital data and follows up on these results.  Also Note the following: If you experience worsening of your admission symptoms, develop shortness of breath, life threatening emergency, suicidal or homicidal thoughts you must seek medical attention immediately by calling 911 or calling your MD immediately  if symptoms less severe.  You must read complete instructions/literature along with all the possible adverse reactions/side effects for all the Medicines you take and that have been prescribed to you. Take any new Medicines after you have completely understood and accpet all the possible adverse reactions/side effects.   Do not drive when taking Pain medications or sleeping medications (Benzodaizepines)  Do not take more than prescribed Pain, Sleep and Anxiety Medications. It is not advisable to combine anxiety,sleep and pain medications without talking with your primary care practitioner  Special Instructions: If you have smoked or chewed Tobacco  in the last 2 yrs please stop smoking, stop any regular Alcohol  and or any Recreational drug  use.  Wear Seat belts while driving.  Please note: You were cared for by a hospitalist during your hospital stay. Once you are discharged, your primary care physician will handle any further medical issues. Please note that NO REFILLS for any discharge medications will be authorized once you are discharged, as it is imperative that you return to your primary care physician (or establish a relationship with a primary care physician if you do not have one) for your post hospital discharge needs so that they can reassess your need for medications and monitor your lab values.  Total Time spent coordinating discharge including counseling, education and face to face time equals greater than 30 minutes.  SignedOren Binet 07/14/2021 10:36 AM

## 2021-07-14 NOTE — Plan of Care (Signed)
  Problem: Education: Goal: Ability to describe self-care measures that may prevent or decrease complications (Diabetes Survival Skills Education) will improve Outcome: Progressing   Problem: Nutritional: Goal: Maintenance of adequate nutrition will improve Outcome: Progressing   Problem: Skin Integrity: Goal: Risk for impaired skin integrity will decrease Outcome: Progressing
